# Patient Record
Sex: Female | Born: 1945 | Hispanic: No | Marital: Single | Smoking: Never smoker
Health system: Southern US, Community
[De-identification: ages and names within clinical notes are randomized; demographics above are authoritative.]

## PROBLEM LIST (undated history)

## (undated) DIAGNOSIS — I10 Essential (primary) hypertension: Secondary | ICD-10-CM

## (undated) DIAGNOSIS — E119 Type 2 diabetes mellitus without complications: Secondary | ICD-10-CM

---

## 2004-11-30 ENCOUNTER — Ambulatory Visit: Payer: Self-pay | Admitting: Internal Medicine

## 2006-05-03 ENCOUNTER — Ambulatory Visit: Payer: Self-pay

## 2007-07-17 ENCOUNTER — Ambulatory Visit: Payer: Self-pay | Admitting: Family Medicine

## 2009-04-02 ENCOUNTER — Ambulatory Visit: Payer: Self-pay | Admitting: Family Medicine

## 2009-07-27 ENCOUNTER — Ambulatory Visit: Payer: Self-pay | Admitting: Gastroenterology

## 2010-06-09 ENCOUNTER — Ambulatory Visit: Payer: Self-pay | Admitting: Family Medicine

## 2010-06-22 ENCOUNTER — Ambulatory Visit: Payer: Self-pay | Admitting: Family Medicine

## 2011-01-05 ENCOUNTER — Ambulatory Visit: Payer: Self-pay | Admitting: Family Medicine

## 2011-07-18 ENCOUNTER — Ambulatory Visit: Payer: Self-pay | Admitting: Family Medicine

## 2012-04-23 ENCOUNTER — Ambulatory Visit: Payer: Self-pay | Admitting: Family Medicine

## 2012-06-13 ENCOUNTER — Ambulatory Visit: Payer: Self-pay | Admitting: Family Medicine

## 2012-07-23 ENCOUNTER — Ambulatory Visit: Payer: Self-pay | Admitting: Family Medicine

## 2013-07-24 ENCOUNTER — Ambulatory Visit: Payer: Self-pay | Admitting: Family Medicine

## 2014-07-31 ENCOUNTER — Inpatient Hospital Stay: Payer: Self-pay | Admitting: Internal Medicine

## 2014-07-31 LAB — CBC WITH DIFFERENTIAL/PLATELET
BASOS PCT: 0.8 %
Basophil #: 0.1 10*3/uL (ref 0.0–0.1)
EOS PCT: 6 %
Eosinophil #: 0.8 10*3/uL — ABNORMAL HIGH (ref 0.0–0.7)
HCT: 42.3 % (ref 35.0–47.0)
HGB: 14.5 g/dL (ref 12.0–16.0)
LYMPHS ABS: 1.6 10*3/uL (ref 1.0–3.6)
Lymphocyte %: 12.5 %
MCH: 31.2 pg (ref 26.0–34.0)
MCHC: 34.4 g/dL (ref 32.0–36.0)
MCV: 91 fL (ref 80–100)
MONO ABS: 1.1 x10 3/mm — AB (ref 0.2–0.9)
MONOS PCT: 8.8 %
NEUTROS ABS: 9.1 10*3/uL — AB (ref 1.4–6.5)
NEUTROS PCT: 71.9 %
PLATELETS: 310 10*3/uL (ref 150–440)
RBC: 4.66 10*6/uL (ref 3.80–5.20)
RDW: 13.1 % (ref 11.5–14.5)
WBC: 12.6 10*3/uL — ABNORMAL HIGH (ref 3.6–11.0)

## 2014-07-31 LAB — COMPREHENSIVE METABOLIC PANEL
ANION GAP: 10 (ref 7–16)
Albumin: 3.8 g/dL (ref 3.4–5.0)
Alkaline Phosphatase: 63 U/L (ref 46–116)
BILIRUBIN TOTAL: 1 mg/dL (ref 0.2–1.0)
BUN: 16 mg/dL (ref 7–18)
CHLORIDE: 106 mmol/L (ref 98–107)
CREATININE: 0.79 mg/dL (ref 0.60–1.30)
Calcium, Total: 9.2 mg/dL (ref 8.5–10.1)
Co2: 26 mmol/L (ref 21–32)
GLUCOSE: 114 mg/dL — AB (ref 65–99)
OSMOLALITY: 285 (ref 275–301)
Potassium: 4.1 mmol/L (ref 3.5–5.1)
SGOT(AST): 23 U/L (ref 15–37)
SGPT (ALT): 22 U/L (ref 14–63)
Sodium: 142 mmol/L (ref 136–145)
Total Protein: 7.9 g/dL (ref 6.4–8.2)

## 2014-07-31 LAB — TROPONIN I: Troponin-I: <0.02 ng/mL

## 2014-08-01 DIAGNOSIS — I351 Nonrheumatic aortic (valve) insufficiency: Secondary | ICD-10-CM

## 2014-08-01 LAB — LIPID PANEL
Cholesterol: 121 mg/dL (ref 0–200)
HDL Cholesterol: 44 mg/dL (ref 40–60)
Ldl Cholesterol, Calc: 60 mg/dL (ref 0–100)
TRIGLYCERIDES: 83 mg/dL (ref 0–200)
VLDL Cholesterol, Calc: 17 mg/dL (ref 5–40)

## 2014-08-01 LAB — CBC WITH DIFFERENTIAL/PLATELET
BASOS PCT: 0.7 %
Basophil #: 0.1 10*3/uL (ref 0.0–0.1)
EOS PCT: 7.6 %
Eosinophil #: 0.7 10*3/uL (ref 0.0–0.7)
HCT: 39.4 % (ref 35.0–47.0)
HGB: 13.7 g/dL (ref 12.0–16.0)
Lymphocyte #: 1.7 10*3/uL (ref 1.0–3.6)
Lymphocyte %: 18.1 %
MCH: 31.3 pg (ref 26.0–34.0)
MCHC: 34.9 g/dL (ref 32.0–36.0)
MCV: 90 fL (ref 80–100)
MONO ABS: 0.9 x10 3/mm (ref 0.2–0.9)
Monocyte %: 9.5 %
Neutrophil #: 6.2 10*3/uL (ref 1.4–6.5)
Neutrophil %: 64.1 %
Platelet: 287 10*3/uL (ref 150–440)
RBC: 4.39 10*6/uL (ref 3.80–5.20)
RDW: 12.7 % (ref 11.5–14.5)
WBC: 9.7 10*3/uL (ref 3.6–11.0)

## 2014-08-01 LAB — URINALYSIS, COMPLETE
BACTERIA: NONE SEEN
BILIRUBIN, UR: NEGATIVE
BLOOD: NEGATIVE
GLUCOSE, UR: NEGATIVE mg/dL (ref 0–75)
KETONE: NEGATIVE
Leukocyte Esterase: NEGATIVE
Nitrite: NEGATIVE
Ph: 6 (ref 4.5–8.0)
Protein: NEGATIVE
RBC,UR: 1 /HPF (ref 0–5)
SPECIFIC GRAVITY: 1.011 (ref 1.003–1.030)
WBC UR: 2 /HPF (ref 0–5)

## 2014-08-01 LAB — BASIC METABOLIC PANEL
Anion Gap: 6 — ABNORMAL LOW (ref 7–16)
BUN: 14 mg/dL (ref 7–18)
CREATININE: 0.63 mg/dL (ref 0.60–1.30)
Calcium, Total: 8.3 mg/dL — ABNORMAL LOW (ref 8.5–10.1)
Chloride: 108 mmol/L — ABNORMAL HIGH (ref 98–107)
Co2: 27 mmol/L (ref 21–32)
EGFR (African American): >60
EGFR (Non-African Amer.): >60
GLUCOSE: 107 mg/dL — AB (ref 65–99)
Osmolality: 282 (ref 275–301)
POTASSIUM: 3.8 mmol/L (ref 3.5–5.1)
SODIUM: 141 mmol/L (ref 136–145)

## 2014-08-01 LAB — TSH: THYROID STIMULATING HORM: 0.423 u[IU]/mL — AB

## 2014-08-01 LAB — T4, FREE: Free Thyroxine: 1.41 ng/dL (ref 0.76–1.46)

## 2014-08-01 LAB — HEMOGLOBIN A1C: Hemoglobin A1C: 6.2 % (ref 4.2–6.3)

## 2014-08-01 LAB — MAGNESIUM: Magnesium: 2.1 mg/dL

## 2014-08-18 ENCOUNTER — Encounter: Payer: Self-pay | Admitting: Internal Medicine

## 2014-08-26 ENCOUNTER — Encounter: Payer: Self-pay | Admitting: Internal Medicine

## 2014-10-26 NOTE — H&P (Signed)
PATIENT NAMGlorious Peach:  Gaugh, Alyla MR#:  [id number] DATE OF BIRTH:  [birth year]  DATE OF ADMISSION:  07/31/2014  ADDENDUM  ALLERGIES: NONE.   HOME MEDICATIONS: Zyrtec 10 mg p.o. daily, Zocor 20 mg p.o. at bedtime, Tylenol Arthritis caplet 650 mg p.o. every 8 hours p.r.n., Symbicort 160 mcg/4.5 mcg inhalation 2 puffs twice a day, ProAir HFA CFC 90 mcg 2 puffs 4 times a day, metformin 1000 mg p.o. b.i.d., meloxicam 15 mg p.o. daily, lisinopril 40 mg p.o. daily, gabapentin 800 mg p.o. daily, Fosamax at 70 mg p.o. once a week, calcium 600 +D 600 mg 1 tablet 3 times a day.   ____________________________ Shaune PollackQing Jisselle Poth, MD qc:ap D: 07/31/2014 13:18:07 ET T: 07/31/2014 13:28:46 ET JOB#: [id number]  cc: Shaune PollackQing Bertrice Leder, MD, <Dictator> Shaune PollackQING Jacobb Alen MD ELECTRONICALLY SIGNED 08/01/2014 20:19

## 2014-10-26 NOTE — Discharge Summary (Signed)
PATIENT NAMECORALIE, [name] MR#:  [id number] DATE OF BIRTH:  [birth year]  DATE OF ADMISSION:  07/31/2014 DATE OF DISCHARGE:  08/01/2014  ADMITTING PHYSICIAN: Shaune Pollack, MD   DISCHARGING PHYSICIAN: Enid Baas, MD  PRIMARY CARE PHYSICIAN: Angus Palms, MD  CONSULTATION IN HOSPITAL: Neurology consultation by Pauletta Browns, MD.  DISCHARGE DIAGNOSES:  1.  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  2.  Hypertension.  3.  Arthritis.  4.  Osteoporosis.  5.  Diabetes mellitus.  6.  Neuropathy.   DISCHARGE HOME MEDICATIONS:  1.  Calcium and vitamin D 600 mg/200 international units 1 tablet p.o. 3 times a day.  2.  Fosamax 70 mg p.o. once a week.  3.  Meloxicam 15 mg p.o. daily.  4.  Gabapentin 800 mg p.o. daily.  5.  Tylenol Arthritis 650 mg q. 8 hours p.r.n.  6.  Zyrtec 10 mg p.o. daily.  7.  Lisinopril 40 mg p.o. daily.  8.  ProAir inhaler 2 puffs 4 times a day.  9.  Symbicort 160/4.5 mcg 2 puffs b.i.d.  10.  Metformin 1000 mg p.o. b.i.d.  11.  Zocor 20 mg p.o. daily.  12.  Aspirin 325 mg p.o. daily.   DISCHARGE DIET: Low-sodium, ADA, 1800-calorie diet.   DISCHARGE ACTIVITY: As tolerated.    FOLLOWUP INSTRUCTIONS:  1.  PCP followup in 1 to 2 weeks.  2.  Occupational therapy for left hand as an outpatient.   LABORATORY DATA AND IMAGING STUDIES: Prior to discharge, WBC 9.7, hemoglobin 13.7, hematocrit 39.4, platelet count 287,000. Sodium 141, potassium 3.8, chloride 108, bicarbonate 27, BUN 14, creatinine 0.63, glucose 107, calcium of 8.3. Magnesium 2.1. HbA1c 6.2. TSH is 0.423, but free T4 is normal at 1.41. Echo  Doppler showing LV ejection fraction 55% to 60%, normal EF, impaired relaxation of LV diastolic filling. MRI of the brain without contrast showing 1 x 1 x 2 cm acute infarction affecting right posterior basal ganglion and corona radiata. No acute hemorrhage, swelling, or mass effect noted. 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 Urinalysis negative for any infection. Carotid Dopplers showing no hemodynamically significant stenosis in both arteries. Chest x-ray: Clear lung fields. No consolidations seen. CT of the head without contrast showing no acute cortical infarction. Mild cerebellar atrophy noted. Small focal area of decreased attenuation in right basal ganglia could be ischemia.   BRIEF HOSPITAL COURSE: [name] is a 69 year old [place] female with past medical history significant for arthritis, hypertension, diabetes, who presents from home secondary to slurred speech and also left-sided weakness.  1.  Acute CVA: CT of the head showed possibility of right basal ganglion infarct. Seen by neurology. The patient's speech has improved and left-sided weakness has improved except left  arm and left hand weakness and decreased grip. MRI confirmed infarct and she was not on any antiplatelet agents at home, so started on aspirin. Statin is being continued. LDL is well-controlled at this time. Carotid Dopplers with no hemodynamically significant stenosis. Echo does not show any thrombus. The patient worked with physical therapy and actually did very fine. She will just need outpatient occupational therapy at this time.  2.  All of her other home medications for diabetes and hypertension are being continued without any changes.   DISCHARGE CONDITION: Stable.   DISCHARGE DISPOSITION: Home.   TIME SPENT ON DISCHARGE: 40 minutes.   CODE STATUS: FULL CODE.     ____________________________ Enid Baas, MD rk:ts D:  08/01/2014 15:53:00 ET T: 08/02/2014 01:12:17 ET JOB#: [id number]  cc: Enid Baasadhika Javeah Loeza, MD, <Dictator> Angus PalmsSionne George, MD Enid BaasADHIKA Mylie Mccurley MD ELECTRONICALLY SIGNED 08/04/2014 17:10

## 2014-10-26 NOTE — H&P (Signed)
PATIENT NAMGlorious Stark:  [name], [name] MR#:  [id number] DATE OF BIRTH:  26-Oct-1945  DATE OF ADMISSION:  07/31/2014  PRIMARY CARE PHYSICIAN: Dr. Salli RealGeotre  REFERRING PHYSICIAN: Sheryl L. Mindi JunkerGottlieb, MD  CHIEF COMPLAINT: Left arm weakness and slurred speech since yesterday.   HISTORY OF PRESENT ILLNESS: A 69 year old [place] female with a history of hypertension, diabetes, hyperlipidemia, who was sent from home to ED due to left arm weakness with slurred speech yesterday. The patient is alert, awake, oriented. The patient speaks little [place]. She cannot provide detailed information. According to the patient's son, the patient started to have the left arm weakness and slurred speech during dinner time last night. In addition, the patient felt dizzy, but no headache. No fever or chills. The patient denies any nausea, vomiting, or diarrhea. No incontinence. No loss of consciousness or syncope or seizure. The patient's symptoms are a little bit better today, but she still has slurred speech, so to the patient came to the ED for further evaluation. The patient's CAT scan of the head showed possible CVA. She was treated with aspirin 325 mg 1 dose in the ED.   PAST MEDICAL HISTORY: Hypertension, diabetes, and hyperlipidemia.   SOCIAL HISTORY: No smoking or drinking or illicit drugs.   PAST SURGICAL HISTORY: Eye surgery a long time ago.   FAMILY HISTORY: Diabetes. No stroke or heart attack.   REVIEW OF SYSTEMS:  CONSTITUTIONAL: The patient denies any fever or chills. No headache, but has dizziness and weakness.  EYES: No double vision, blurry vision.  ENT: No postnasal drip, but has slurred speech. No dysphagia.  CARDIOVASCULAR: No chest pain, palpitation, orthopnea, or nocturnal dyspnea. No leg edema.  PULMONARY: No cough, sputum, shortness of breath, or hematemesis.   GASTROINTESTINAL: No abdominal pain, nausea, vomiting, or diarrhea. No melena or bloody stool.  GENITOURINARY: No dysuria, hematuria, or  incontinence.  SKIN: No rash or jaundice.  NEUROLOGIC: Positive for left arm weakness, slurred speech. No seizure, syncope, or loss of consciousness.  HEMATOLOGY: No easy bleeding or bleeding.  ENDOCRINOLOGY: No polyuria, polydipsia, heat or cold intolerance.   PHYSICAL EXAMINATION:   VITAL SIGNS: Temperature 98.1, blood pressure 153/81, pulse 86, oxygen saturation 96% on room air.  GENERAL: The patient is alert, awake, oriented, in no acute distress.  HEENT: Pupils round, equal and reactive to light and accommodation. Moist oral mucosa. Clear oropharynx.  NECK: Supple. No JVD or carotid bruit. No lymphadenopathy. No thyromegaly.  CARDIOVASCULAR: S1, S2 regular rate and rhythm. No murmurs or gallops. PULMONARY: Bilateral air entry. No wheezing or rales. No use of accessory muscle to breathe.  ABDOMEN: Soft. No distention or tenderness. No organomegaly. Bowel sounds present.  EXTREMITIES: No edema, clubbing or cyanosis. No calf tenderness. Bilateral pedal pulses present.  SKIN: No rash or jaundice.  NEUROLOGIC: A and O x3. Mild facial droop on the left side. It is difficult and slow to move the tongue to the left side. Power 5/5. Sensation intact. Left arm is a little bit weaker than the other extremities.   Chest x-ray showed no lung edema or consolidation. CAT scan of head showed no acute cortical infarction, but has mild cerebral atrophy, periventricular and patchy subcortical white matter decreased attenuation, probably due to chronic small vessel ischemic changes. There is a small focal area of decreased attenuation on the right basal ganglia, measures about 8.5 mm. This may be due to his ischemia of indeterminate age.   WBC 12.6, hemoglobin 14.5, platelets 310, glucose 114, BUN 16, creatinine 0.79,  electrolytes normal. Troponin less than 0.02. EKG showed normal sinus rhythm at 88 BPM.   IMPRESSIONS:  1. Acute cerebrovascular accident.  2. Hypertension.  3. Diabetes.  4. Leukocytosis,  possibly due to reaction.   PLAN OF TREATMENT:  1. The patient will be admitted to medical floor with a telemonitor. We will get an MRI of the brain to confirm stroke.  2. We will get an echocardiograph and a carotid duplex and a neurology consult, start neuro check, continue aspirin, and start statin, get a physical therapy evaluation, and a speech study.  3. We will continue the patient's hypertension medication, lisinopril 40 mg p.o. daily. For diabetes, we will start sliding scale and hold metformin.  4. I discussed the patient's condition and plan of treatment with the patient and the patient's son.  CODE STATUS: The patient wants full code.   TIME SPENT: About 56 minutes   ____________________________ Shaune Pollack, MD qc:ap D: 07/31/2014 13:15:21 ET T: 07/31/2014 13:33:21 ET JOB#: [id number]  cc: Shaune Pollack, MD, <Dictator> Shaune Pollack MD ELECTRONICALLY SIGNED 08/04/2014 10:51

## 2014-10-26 NOTE — Consult Note (Signed)
PATIENT NAMGlorious Stark:  [name], Laurice MR#:  [id number] DATE OF BIRTH:  February 15, 1946  DATE OF CONSULTATION:  07/31/2014  REFERRING PHYSICIAN:   CONSULTING PHYSICIAN:  Pauletta BrownsYuriy Darriel Utter, MD  REASON FOR CONSULTATION: Stroke.   HISTORY OF PRESENT ILLNESS: This is a 69 year old [place] female admitted with left upper extremity weakness. Information obtained from the patient's friend who is at bedside, who is English-speaking, as the patient is poorly [place] speaking. She has a past medical history of hypertension, diabetes, hyperlipidemia, presents with a 1-day history of left upper extremity weakness and questionable left facial droop. The patient was having dinner and felt like her left arm was weak.  At home, not on any antiplatelet therapy. Symptoms have improved, but still not back to baseline. No visual changes. No weakness in her lower extremities.   PAST MEDICAL HISTORY: Hypertension, diabetes, hyperlipidemia.   SOCIAL HISTORY: No smoking, drinking, or illicit drug use.   PAST SURGICAL HISTORY: Eye surgery.   FAMILY HISTORY: Noncontributory.   REVIEW OF SYSTEMS: With translation from the patient's friend who is at bedside.  RESPIRATORY: No shortness of breath.  CARDIOVASCULAR: No chest pain.  GASTROINTESTINAL: No abdominal pain.  MUSCULOSKELETAL: Positive weakness in the left upper extremity.  ENDOCRINE: No heat or cold intolerance.   NEUROLOGIC EVALUATION: The patient is alert, awake and oriented. Speech appears to have improved, currently at baseline. Extraocular movements are intact. Questionable left facial droop. Tongue is midline. Uvula elevates symmetrically. Shoulder shrug intact. Left upper extremity drift of 4+. The weakness is proximal at the deltoid. Triceps and biceps are 5/5. Finger grasp is also 5/5 on the left upper extremity. No weakness in the lower extremities. Sensation intact to light touch and temperature. Coordination: Finger-to-nose intact. Gait not assessed. Sensation  intact.   IMAGING STUDIES: : There was a questionable right basal ganglia ischemic stroke on the CAT scan with white matter changes from the history of hypertension.   LABORATORY DATA: Work-up has been reviewed.   IMPRESSION: A 69 year old female with history of hypertension, diabetes, hyperlipidemia, presenting with a 1-day history of left upper extremity weakness that has been improving, as well as slurred speech that has improved and is, per family friend, close to baseline. Suspect right basal ganglia stroke that I see on the CAT scan. I agree with obtaining MRI of the brain. The patient was not on any antiplatelet therapy. On aspirin 325 now. She will continue that as an outpatient. Statin therapy, physical therapy, occupational therapy, ultrasound, carotid Doppler.  Thank you, it was a pleasure seeing this patient. This case was discussed with the patient and the patient's family and friend at bedside.    ____________________________ Pauletta BrownsYuriy Eternity Dexter, MD yz:mw D: 07/31/2014 14:58:33 ET T: 07/31/2014 15:19:33 ET JOB#: [id number]  cc: Pauletta BrownsYuriy Lakasha Mcfall, MD, <Dictator> Pauletta BrownsYURIY Tajia Szeliga MD ELECTRONICALLY SIGNED 08/05/2014 12:10

## 2014-11-05 ENCOUNTER — Other Ambulatory Visit: Payer: Self-pay | Admitting: Family Medicine

## 2014-11-05 DIAGNOSIS — Z1231 Encounter for screening mammogram for malignant neoplasm of breast: Secondary | ICD-10-CM

## 2014-11-18 ENCOUNTER — Ambulatory Visit: Payer: Medicare Other

## 2014-11-19 ENCOUNTER — Ambulatory Visit
Admission: RE | Admit: 2014-11-19 | Discharge: 2014-11-19 | Disposition: A | Discharge status: Home / Self Care | Payer: Medicare Other | Source: Ambulatory Visit | Attending: Family Medicine | Admitting: Family Medicine

## 2014-11-19 DIAGNOSIS — Z1231 Encounter for screening mammogram for malignant neoplasm of breast: Secondary | ICD-10-CM | POA: Insufficient documentation

## 2014-12-18 ENCOUNTER — Other Ambulatory Visit: Payer: Self-pay | Admitting: Family Medicine

## 2014-12-18 DIAGNOSIS — M810 Age-related osteoporosis without current pathological fracture: Secondary | ICD-10-CM

## 2014-12-22 ENCOUNTER — Ambulatory Visit
Admission: RE | Admit: 2014-12-22 | Discharge: 2014-12-22 | Disposition: A | Discharge status: Home / Self Care | Payer: Medicare Other | Source: Ambulatory Visit | Attending: Family Medicine | Admitting: Family Medicine

## 2014-12-22 DIAGNOSIS — M810 Age-related osteoporosis without current pathological fracture: Secondary | ICD-10-CM | POA: Diagnosis present

## 2015-10-13 ENCOUNTER — Other Ambulatory Visit: Payer: Self-pay | Admitting: Family Medicine

## 2015-10-13 DIAGNOSIS — Z1231 Encounter for screening mammogram for malignant neoplasm of breast: Secondary | ICD-10-CM

## 2015-11-24 ENCOUNTER — Ambulatory Visit
Admission: RE | Admit: 2015-11-24 | Discharge: 2015-11-24 | Disposition: A | Discharge status: Home / Self Care | Payer: Medicare Other | Source: Ambulatory Visit | Attending: Family Medicine | Admitting: Family Medicine

## 2015-11-24 ENCOUNTER — Other Ambulatory Visit: Payer: Self-pay | Admitting: Family Medicine

## 2015-11-24 DIAGNOSIS — Z1231 Encounter for screening mammogram for malignant neoplasm of breast: Secondary | ICD-10-CM

## 2016-10-12 ENCOUNTER — Other Ambulatory Visit: Payer: Self-pay | Admitting: Family Medicine

## 2016-10-12 DIAGNOSIS — Z1231 Encounter for screening mammogram for malignant neoplasm of breast: Secondary | ICD-10-CM

## 2016-11-25 ENCOUNTER — Ambulatory Visit
Admission: RE | Admit: 2016-11-25 | Discharge: 2016-11-25 | Disposition: A | Discharge status: Home / Self Care | Payer: Medicare Other | Source: Ambulatory Visit | Attending: Family Medicine | Admitting: Family Medicine

## 2016-11-25 DIAGNOSIS — Z1231 Encounter for screening mammogram for malignant neoplasm of breast: Secondary | ICD-10-CM | POA: Insufficient documentation

## 2016-12-01 ENCOUNTER — Other Ambulatory Visit: Payer: Self-pay | Admitting: Family Medicine

## 2016-12-01 DIAGNOSIS — M810 Age-related osteoporosis without current pathological fracture: Secondary | ICD-10-CM

## 2017-01-19 ENCOUNTER — Ambulatory Visit
Admission: RE | Admit: 2017-01-19 | Discharge: 2017-01-19 | Disposition: A | Discharge status: Home / Self Care | Payer: Medicare Other | Source: Ambulatory Visit | Attending: Family Medicine | Admitting: Family Medicine

## 2017-01-19 DIAGNOSIS — M810 Age-related osteoporosis without current pathological fracture: Secondary | ICD-10-CM | POA: Insufficient documentation

## 2017-01-19 NOTE — Addendum Note (Signed)
Encounter addended by: Cammy CopaBarefoot, Lark Runk L, RT on: 01/19/2017 11:02 AM<BR>    Actions taken: Imaging Exam ended

## 2017-08-22 ENCOUNTER — Other Ambulatory Visit: Payer: Self-pay | Admitting: Family Medicine

## 2017-08-22 ENCOUNTER — Ambulatory Visit
Admission: RE | Admit: 2017-08-22 | Discharge: 2017-08-22 | Disposition: A | Discharge status: Home / Self Care | Payer: Medicare Other | Source: Ambulatory Visit | Attending: Family Medicine | Admitting: Family Medicine

## 2017-08-22 DIAGNOSIS — R531 Weakness: Secondary | ICD-10-CM | POA: Diagnosis not present

## 2017-08-22 DIAGNOSIS — I6782 Cerebral ischemia: Secondary | ICD-10-CM | POA: Diagnosis not present

## 2017-08-22 DIAGNOSIS — G319 Degenerative disease of nervous system, unspecified: Secondary | ICD-10-CM | POA: Insufficient documentation

## 2017-08-22 DIAGNOSIS — Z8673 Personal history of transient ischemic attack (TIA), and cerebral infarction without residual deficits: Secondary | ICD-10-CM | POA: Insufficient documentation

## 2019-10-31 ENCOUNTER — Other Ambulatory Visit: Payer: Self-pay

## 2019-10-31 ENCOUNTER — Emergency Department: Payer: Medicare Other

## 2019-10-31 ENCOUNTER — Inpatient Hospital Stay
Admission: EM | Admit: 2019-10-31 | Discharge: 2019-11-03 | DRG: 871 | Disposition: A | Discharge status: Home Health | Payer: Medicare Other | Attending: Internal Medicine | Admitting: Internal Medicine

## 2019-10-31 ENCOUNTER — Encounter: Payer: Self-pay | Admitting: Emergency Medicine

## 2019-10-31 DIAGNOSIS — Z20822 Contact with and (suspected) exposure to covid-19: Secondary | ICD-10-CM | POA: Diagnosis present

## 2019-10-31 DIAGNOSIS — E785 Hyperlipidemia, unspecified: Secondary | ICD-10-CM | POA: Diagnosis present

## 2019-10-31 DIAGNOSIS — Z7982 Long term (current) use of aspirin: Secondary | ICD-10-CM

## 2019-10-31 DIAGNOSIS — I10 Essential (primary) hypertension: Secondary | ICD-10-CM | POA: Diagnosis present

## 2019-10-31 DIAGNOSIS — J45901 Unspecified asthma with (acute) exacerbation: Secondary | ICD-10-CM | POA: Diagnosis present

## 2019-10-31 DIAGNOSIS — N10 Acute tubulo-interstitial nephritis: Secondary | ICD-10-CM | POA: Diagnosis present

## 2019-10-31 DIAGNOSIS — A4151 Sepsis due to Escherichia coli [E. coli]: Principal | ICD-10-CM | POA: Diagnosis present

## 2019-10-31 DIAGNOSIS — Z79899 Other long term (current) drug therapy: Secondary | ICD-10-CM

## 2019-10-31 DIAGNOSIS — J9601 Acute respiratory failure with hypoxia: Secondary | ICD-10-CM | POA: Diagnosis present

## 2019-10-31 DIAGNOSIS — Z833 Family history of diabetes mellitus: Secondary | ICD-10-CM

## 2019-10-31 DIAGNOSIS — E1165 Type 2 diabetes mellitus with hyperglycemia: Secondary | ICD-10-CM | POA: Diagnosis present

## 2019-10-31 DIAGNOSIS — N12 Tubulo-interstitial nephritis, not specified as acute or chronic: Secondary | ICD-10-CM

## 2019-10-31 DIAGNOSIS — A419 Sepsis, unspecified organism: Secondary | ICD-10-CM | POA: Diagnosis present

## 2019-10-31 DIAGNOSIS — Z7983 Long term (current) use of bisphosphonates: Secondary | ICD-10-CM

## 2019-10-31 DIAGNOSIS — Z8673 Personal history of transient ischemic attack (TIA), and cerebral infarction without residual deficits: Secondary | ICD-10-CM

## 2019-10-31 DIAGNOSIS — Z7951 Long term (current) use of inhaled steroids: Secondary | ICD-10-CM

## 2019-10-31 DIAGNOSIS — Z8701 Personal history of pneumonia (recurrent): Secondary | ICD-10-CM

## 2019-10-31 DIAGNOSIS — Z7984 Long term (current) use of oral hypoglycemic drugs: Secondary | ICD-10-CM

## 2019-10-31 HISTORY — DX: Essential (primary) hypertension: I10

## 2019-10-31 HISTORY — DX: Type 2 diabetes mellitus without complications: E11.9

## 2019-10-31 LAB — COMPREHENSIVE METABOLIC PANEL
ALT: 45 U/L — ABNORMAL HIGH (ref 0–44)
AST: 34 U/L (ref 15–41)
Albumin: 3.4 g/dL — ABNORMAL LOW (ref 3.5–5.0)
Alkaline Phosphatase: 127 U/L — ABNORMAL HIGH (ref 38–126)
Anion gap: 11 (ref 5–15)
BUN: 17 mg/dL (ref 8–23)
CO2: 22 mmol/L (ref 22–32)
Calcium: 8.5 mg/dL — ABNORMAL LOW (ref 8.9–10.3)
Chloride: 101 mmol/L (ref 98–111)
Creatinine, Ser: 0.83 mg/dL (ref 0.44–1.00)
GFR calc Af Amer: >60 mL/min (ref >60)
GFR calc non Af Amer: >60 mL/min (ref >60)
Glucose, Bld: 331 mg/dL — ABNORMAL HIGH (ref 70–99)
Potassium: 3.8 mmol/L (ref 3.5–5.1)
Sodium: 134 mmol/L — ABNORMAL LOW (ref 135–145)
Total Bilirubin: 1.8 mg/dL — ABNORMAL HIGH (ref 0.3–1.2)
Total Protein: 7 g/dL (ref 6.5–8.1)

## 2019-10-31 LAB — CBC WITH DIFFERENTIAL/PLATELET
Abs Immature Granulocytes: 0.09 10*3/uL — ABNORMAL HIGH (ref 0.00–0.07)
Basophils Absolute: 0.1 10*3/uL (ref 0.0–0.1)
Basophils Relative: 0 %
Eosinophils Absolute: 0 10*3/uL (ref 0.0–0.5)
Eosinophils Relative: 0 %
HCT: 34.7 % — ABNORMAL LOW (ref 36.0–46.0)
Hemoglobin: 12.3 g/dL (ref 12.0–15.0)
Immature Granulocytes: 1 %
Lymphocytes Relative: 4 %
Lymphs Abs: 0.5 10*3/uL — ABNORMAL LOW (ref 0.7–4.0)
MCH: 31.3 pg (ref 26.0–34.0)
MCHC: 35.4 g/dL (ref 30.0–36.0)
MCV: 88.3 fL (ref 80.0–100.0)
Monocytes Absolute: 0.7 10*3/uL (ref 0.1–1.0)
Monocytes Relative: 5 %
Neutro Abs: 11.8 10*3/uL — ABNORMAL HIGH (ref 1.7–7.7)
Neutrophils Relative %: 90 %
Platelets: 280 10*3/uL (ref 150–400)
RBC: 3.93 MIL/uL (ref 3.87–5.11)
RDW: 11.7 % (ref 11.5–15.5)
WBC: 13.1 10*3/uL — ABNORMAL HIGH (ref 4.0–10.5)
nRBC: 0 % (ref 0.0–0.2)

## 2019-10-31 LAB — APTT: aPTT: 34 seconds (ref 24–36)

## 2019-10-31 LAB — TROPONIN I (HIGH SENSITIVITY): Troponin I (High Sensitivity): 17 ng/L (ref <18)

## 2019-10-31 LAB — POC SARS CORONAVIRUS 2 AG: SARS Coronavirus 2 Ag: NEGATIVE

## 2019-10-31 LAB — PROTIME-INR
INR: 1.1 (ref 0.8–1.2)
Prothrombin Time: 13.6 seconds (ref 11.4–15.2)

## 2019-10-31 LAB — LACTIC ACID, PLASMA: Lactic Acid, Venous: 1.7 mmol/L (ref 0.5–1.9)

## 2019-10-31 MED ORDER — ACETAMINOPHEN 500 MG PO TABS
1000.0000 mg | ORAL_TABLET | Freq: Once | ORAL | Status: AC
  Administered 2019-10-31: 1000 mg via ORAL
  Filled 2019-10-31: qty 2

## 2019-10-31 MED ORDER — SODIUM CHLORIDE 0.9 % IV BOLUS
1000.0000 mL | Freq: Once | INTRAVENOUS | Status: AC
  Administered 2019-10-31: 1000 mL via INTRAVENOUS

## 2019-10-31 NOTE — ED Triage Notes (Signed)
PT to ED via EMS from home c/o increased fatigue last couple days and trouble breathing. PT room air sats are 89%. On 2L pt is 94%. PT also states abd is more swollen than normal. PT alert/oriented/keenly responsive.

## 2019-10-31 NOTE — ED Provider Notes (Signed)
[facility] Emergency Department Provider Note  ____________________________________________   First MD Initiated Contact with Patient 10/31/19 2303     (approximate)  I have reviewed the triage vital signs and the nursing notes.   HISTORY  Chief Complaint Shortness of Breath and Fatigue    HPI [name] is a 74 y.o. female with prior CVA, diabetes who comes in with shortness of breath and fatigue.  Patient does report some history of lung issues but states that she is not sure where he lives.  On review of [facility] chart it appears that she has a history of some asthma.  Patient comes in for a few days of difficulty breathing.  Things got worse today.  Symptoms are severe, constant, worse with moving, better at rest.  Patient states that she is not normally on oxygen.  Patient sats were 89% on room air and patient was placed on 2 L.  Patient denies any abdominal pain, chest pain, leg swelling.  sHe does report a little dysuria.  [place] interpreter was used for the history and physical and any updates          Past Medical History:  Diagnosis Date  . Diabetes mellitus without complication (HCC)   . Hypertension     There are no problems to display for this patient.   History reviewed. No pertinent surgical history.  Prior to Admission medications   Medication Sig Start Date End Date Taking? Authorizing Provider  alendronate (FOSAMAX) 70 MG tablet Take 70 mg by mouth once a week. 08/19/19  Yes [provider]  amLODipine (NORVASC) 5 MG tablet Take 5 mg by mouth daily. 10/21/19  Yes [provider]  aspirin 81 MG EC tablet Take 81 mg by mouth daily.    Yes [provider]  Calcium Carbonate-Vitamin D 600-400 MG-UNIT tablet Take 1 tablet by mouth daily.    Yes [provider]  cetirizine (ZYRTEC) 10 MG tablet Take 1 tablet by mouth at bedtime   Yes [provider]  gabapentin (NEURONTIN) 800 MG tablet  Take 800 mg by mouth at bedtime. 08/19/19  Yes [provider]  lisinopril (ZESTRIL) 40 MG tablet Take 40 mg by mouth daily. 10/21/19  Yes [provider]  metFORMIN (GLUCOPHAGE) 1000 MG tablet Take 1,000 mg by mouth daily. 10/21/19  Yes [provider]  Omega-3 Fatty Acids (FISH OIL) 1000 MG CAPS Take 1 capsule by mouth daily.    Yes [provider]  simvastatin (ZOCOR) 20 MG tablet Take 20 mg by mouth at bedtime. 10/21/19  Yes [provider]  SYMBICORT 160-4.5 MCG/ACT inhaler Inhale 2 puffs into the lungs daily.  09/11/19  Yes [provider]  VENTOLIN HFA 108 (90 Base) MCG/ACT inhaler Inhale 2 puffs into the lungs every 4 (four) hours as needed.  10/21/19  Yes [provider]    Allergies Patient has no known allergies.  No family history on file.  Social History Social History   Tobacco Use  . Smoking status: Never Smoker  . Smokeless tobacco: Never Used  Substance Use Topics  . Alcohol use: Never  . Drug use: Never      Review of Systems Constitutional: No fever/chills Eyes: No visual changes. ENT: No sore throat. Cardiovascular: No chest pain Respiratory: Positive for SOB Gastrointestinal: No abdominal pain.  No nausea, no vomiting.  No diarrhea.  No constipation. Genitourinary: Positive dysuria Musculoskeletal: Negative for back pain. Skin: Negative for rash. Neurological: Negative for headaches,  focal weakness or numbness. All other ROS negative ____________________________________________   PHYSICAL EXAM:  VITAL SIGNS: Blood pressure (!) 123/58, pulse (!) 118, temperature (!) 100.6 F (38.1 C), temperature source Oral, resp. rate (!) 28, height 5' (1.524 m), weight 40 kg, SpO2 93 %.   Constitutional: Alert and oriented. Well appearing and in no acute distress. Eyes: Conjunctivae are normal. EOMI. Head: Atraumatic. Nose: No congestion/rhinnorhea. Mouth/Throat: Mucous membranes are moist.   Neck: No  stridor. Trachea Midline. FROM Cardiovascular: Tachycardic, regular rhythm. Grossly normal heart sounds.  Good peripheral circulation. Respiratory: Clear lungs although satting 94% on 2 L Gastrointestinal: Soft and nontender. No distention. No abdominal bruits.  Musculoskeletal: No lower extremity tenderness nor edema.  No joint effusions. Neurologic:  Normal speech and language. No gross focal neurologic deficits are appreciated.  Skin:  Skin is warm, dry and intact. No rash noted. Psychiatric: Mood and affect are normal. Speech and behavior are normal. GU: Deferred   ____________________________________________   LABS (all labs ordered are listed, but only abnormal results are displayed)  Labs Reviewed  COMPREHENSIVE METABOLIC PANEL - Abnormal; Notable for the following components:      Result Value   Sodium 134 (*)    Glucose, Bld 331 (*)    Calcium 8.5 (*)    Albumin 3.4 (*)    ALT 45 (*)    Alkaline Phosphatase 127 (*)    Total Bilirubin 1.8 (*)    All other components within normal limits  CBC WITH DIFFERENTIAL/PLATELET - Abnormal; Notable for the following components:   WBC 13.1 (*)    HCT 34.7 (*)    Neutro Abs 11.8 (*)    Lymphs Abs 0.5 (*)    Abs Immature Granulocytes 0.09 (*)    All other components within normal limits  URINALYSIS, ROUTINE W REFLEX MICROSCOPIC - Abnormal; Notable for the following components:   Color, Urine YELLOW (*)    APPearance HAZY (*)    Glucose, UA >=500 (*)    Hgb urine dipstick SMALL (*)    Ketones, ur 5 (*)    Protein, ur 100 (*)    Nitrite POSITIVE (*)    Leukocytes,Ua MODERATE (*)    WBC, UA >50 (*)    All other components within normal limits  BRAIN NATRIURETIC PEPTIDE - Abnormal; Notable for the following components:   B Natriuretic Peptide 133.0 (*)    All other components within normal limits  TROPONIN I (HIGH SENSITIVITY) - Abnormal; Notable for the following components:   Troponin I (High Sensitivity) 22 (*)    All other  components within normal limits  RESPIRATORY PANEL BY RT PCR (FLU A&B, COVID)  CULTURE, BLOOD (ROUTINE X 2)  CULTURE, BLOOD (ROUTINE X 2)  URINE CULTURE  LACTIC ACID, PLASMA  APTT  PROTIME-INR  PROCALCITONIN  HIV ANTIBODY (ROUTINE TESTING W REFLEX)  BASIC METABOLIC PANEL  CBC  POC SARS CORONAVIRUS 2 AG -  ED  POC SARS CORONAVIRUS 2 AG  TROPONIN I (HIGH SENSITIVITY)   ____________________________________________   ED ECG REPORT I, Vanessa Marathon, the attending physician, personally viewed and interpreted this ECG.  EKG is sinus tachycardia rate of 112, no ST elevation, T wave inversion 2 3 aVF V4 through V6. ____________________________________________  RADIOLOGY I, Vanessa Eastman, personally viewed and evaluated these images (plain radiographs) as part of my medical decision making, as well as reviewing the written report by the radiologist.  ED MD interpretation: X-rays negative  Official radiology report(s): CT Angio Chest PE  W and/or Wo Contrast  Addendum Date: 11/01/2019   ADDENDUM REPORT: 11/01/2019 02:15 ADDENDUM: Additional impression point: 7. Heterogeneous, mildly enlarged thyroid extending into the thoracic inlet. Recommend outpatient thyroid ultrasound for further assessment. This follows consensus guidelines: Managing Incidental Thyroid Nodules Detected on Imaging: White Paper of the ACR Incidental Thyroid Findings Committee. J Am Coll Radiol 2015; 12:143-150. and [facility] 3-tiered system for managing ITNs: J Am Coll Radiol. 2015; Feb;12(2): 143-50 Electronically Signed   By: Kreg Shropshire M.D.   On: 11/01/2019 02:15   Result Date: 11/01/2019 CLINICAL DATA:  Shortness of breath, difficulty breathing EXAM: CT ANGIOGRAPHY CHEST WITH CONTRAST TECHNIQUE: Multidetector CT imaging of the chest was performed using the standard protocol during bolus administration of intravenous contrast. Multiplanar CT image reconstructions and MIPs were obtained to evaluate the vascular anatomy.  CONTRAST:  18mL OMNIPAQUE IOHEXOL 350 MG/ML SOLN COMPARISON:  Radiograph 10/31/2019, CT 10/03/2002 (report only) FINDINGS: Cardiovascular: Satisfactory opacification the pulmonary arteries. No central or lobar filling defects are identified. More distal evaluation limited by respiratory motion artifact more pronounced towards the lung bases and apices. Mild cardiomegaly. No pericardial effusion. Coronary artery calcifications are present. There is atherosclerotic calcification of the normal caliber thoracic aorta and normally branching great vessels. No acute luminal or periaortic abnormality is seen. Mediastinum/Nodes: No mediastinal fluid or gas. Heterogeneous, enlarged thyroid gland partially extending into the thoracic inlet. No acute abnormality of the trachea or esophagus. No worrisome mediastinal, hilar or axillary adenopathy. Lungs/Pleura: Parenchymal evaluation limited given extensive respiratory motion artifact. No consolidation, features of edema, pneumothorax, or effusion. Dependent atelectasis posteriorly. More bandlike opacity in the left lung base likely reflect subsegmental atelectasis or scarring. Upper Abdomen: Few dependently layering gallstones within the otherwise normal gallbladder. Fluid attenuation probable cyst measuring up to 2.6 cm seen in the caudate lobe. Diffuse hepatic hypoattenuation compatible with hepatic steatosis. Musculoskeletal: No chest wall mass or suspicious bone lesions identified. Multilevel degenerative changes are present in the imaged portions of the spine. Review of the MIP images confirms the above findings. IMPRESSION: 1. Satisfactory opacification the pulmonary arteries. No central or lobar filling defects are identified. More distal evaluation limited by respiratory motion artifact. 2. No acute cardiopulmonary process. 3. Mild cardiomegaly. Coronary artery calcifications. 4. Hepatic steatosis. 5. Cholelithiasis without CT evidence of acute cholecystitis. 6. Aortic  Atherosclerosis (ICD10-I70.0). Electronically Signed: By: Kreg Shropshire M.D. On: 11/01/2019 00:42   DG Chest Port 1 View  Result Date: 10/31/2019 CLINICAL DATA:  74 year old female with shortness of breath. EXAM: PORTABLE CHEST 1 VIEW COMPARISON:  Chest radiograph dated 07/31/2014. FINDINGS: Mild cardiomegaly with mild vascular congestion. Atypical infiltrate is not excluded clinical correlation is recommended. No focal consolidation, pleural effusion, pneumothorax. Atherosclerotic calcification of the aorta. Degenerative changes of the spine. No acute osseous pathology. IMPRESSION: Mild cardiomegaly with mild vascular congestion. No focal consolidation. Electronically Signed   By: Elgie Collard M.D.   On: 10/31/2019 23:35   CT Renal Stone Study  Result Date: 11/01/2019 CLINICAL DATA:  74 year old female with flank pain. Concern for kidney stone. EXAM: CT ABDOMEN AND PELVIS WITHOUT CONTRAST TECHNIQUE: Multidetector CT imaging of the abdomen and pelvis was performed following the standard protocol without IV contrast. COMPARISON:  Chest CT dated 11/01/2019. FINDINGS: Evaluation of this exam is limited in the absence of intravenous contrast. Lower chest: Minimal bibasilar atelectasis. The visualized lung bases are clear. No intra-abdominal free air or free fluid. Hepatobiliary: Diffuse fatty infiltration of the liver. No intrahepatic biliary ductal dilatation. Gallstone. No pericholecystic fluid.  Pancreas: Unremarkable. No pancreatic ductal dilatation or surrounding inflammatory changes. Spleen: Normal in size without focal abnormality. Adrenals/Urinary Tract: The adrenal glands are unremarkable. There is slight asymmetric enlargement of the left kidney. There is heterogeneous enhancement of the left renal parenchyma with striated pattern nephrogram from recent contrast administration most consistent with left-sided pyelonephritis. Correlation with urinalysis recommended. No drainable fluid collection or  abscess. The right kidney is unremarkable. The visualized ureters and urinary bladder appear unremarkable as well. Stomach/Bowel: There is no bowel obstruction or active inflammation. Mild diffuse thickened appearance of the stomach likely related to underdistention. The appendix is normal. Vascular/Lymphatic: Mild aortoiliac atherosclerotic disease. The IVC is unremarkable. No portal venous gas. There is no adenopathy. Reproductive: Enlarged myomatous uterus. No adnexal masses. Other: None Musculoskeletal: Osteopenia with degenerative changes of the spine. Old L1 compression fracture with anterior wedging. No acute osseous pathology. IMPRESSION: 1. Findings most consistent with left-sided pyelonephritis. Correlation with urinalysis recommended. No drainable fluid collection or abscess. 2. Fatty liver. 3. Cholelithiasis. 4. No bowel obstruction. Normal appendix. 5. Enlarged myomatous uterus. 6. Aortic Atherosclerosis (ICD10-I70.0). Electronically Signed   By: Elgie CollardArash  Radparvar M.D.   On: 11/01/2019 02:35    ____________________________________________   PROCEDURES  Procedure(s) performed (including Critical Care):  .Critical Care Performed by: Concha SeFunke, Andranik Jeune E, MD Authorized by: Concha SeFunke, Kaytlynne Neace E, MD   Critical care provider statement:    Critical care time (minutes):  45   Critical care was necessary to treat or prevent imminent or life-threatening deterioration of the following conditions:  Sepsis   Critical care was time spent personally by me on the following activities:  Discussions with consultants, evaluation of patient's response to treatment, examination of patient, ordering and performing treatments and interventions, ordering and review of laboratory studies, ordering and review of radiographic studies, pulse oximetry, re-evaluation of patient's condition, obtaining history from patient or surrogate and review of old charts     ____________________________________________   INITIAL  IMPRESSION / ASSESSMENT AND PLAN / ED COURSE   [name] PeachBong Stark was evaluated in Emergency Department on 10/31/2019 for the symptoms described in the history of present illness. She was evaluated in the context of the global COVID-19 pandemic, which necessitated consideration that the patient might be at risk for infection with the SARS-CoV-2 virus that causes COVID-19. Institutional protocols and algorithms that pertain to the evaluation of patients at risk for COVID-19 are in a state of rapid change based on information released by regulatory bodies including the CDC and federal and state organizations. These policies and algorithms were followed during the patient's care in the ED.     Pt presents with SOB. Differential includes: PNA-will get xray to evaluation Anemia-CBC to evaluate ACS- will get trops Arrhythmia-Will get EKG and keep on monitor.  COVID- will get testing per algorithm. PE-lower suspicion given no risk factors and other cause more likely  Initially held off on sepsis alert given my concern this could just be Covid.  Patient's Covid test was negative.  CT PE negative as well.  Given this we will initiate sepsis bundle and start broad-spectrum antibiotics.  Patient procalcitonin is significantly elevated therefore I think this is most likely a bacterial infection.  Given this patient son is at bedside who does report that patient has had some dysuria maybe little hematuria and back pain as well.  We will add on CT renal to make sure no signs of infected kidney stone.  UA consistent with UTI CT scan does show pyelonephritis.  After fluid  resuscitation as patient's heart rate has come down.  Blood pressures remained stable.  Patient's temperature also came down with the Tylenol.  Patient is continues to look well.  Patient admitted to the hospital team  ____________________________________________   FINAL CLINICAL IMPRESSION(S) / ED DIAGNOSES   Final diagnoses:  Sepsis, due to  unspecified organism, unspecified whether acute organ dysfunction present (HCC)  Pyelonephritis  Acute respiratory failure with hypoxia (HCC)     MEDICATIONS GIVEN DURING THIS VISIT:  Medications  aspirin EC tablet 81 mg (has no administration in time range)  lisinopril (ZESTRIL) tablet 40 mg (has no administration in time range)  simvastatin (ZOCOR) tablet 20 mg (20 mg Oral Not Given 11/01/19 0245)  gabapentin (NEURONTIN) capsule 800 mg (800 mg Oral Not Given 11/01/19 0245)  calcium-vitamin D (OSCAL WITH D) 500-200 MG-UNIT per tablet 1 tablet (has no administration in time range)  omega-3 acid ethyl esters (LOVAZA) capsule 1 g (has no administration in time range)  loratadine (CLARITIN) tablet 10 mg (has no administration in time range)  albuterol (PROVENTIL) (2.5 MG/3ML) 0.083% nebulizer solution 2.5 mg (has no administration in time range)  enoxaparin (LOVENOX) injection 40 mg (has no administration in time range)  0.9 %  sodium chloride infusion (has no administration in time range)  acetaminophen (TYLENOL) tablet 650 mg (has no administration in time range)    Or  acetaminophen (TYLENOL) suppository 650 mg (has no administration in time range)  traZODone (DESYREL) tablet 25 mg (has no administration in time range)  magnesium hydroxide (MILK OF MAGNESIA) suspension 30 mL (has no administration in time range)  ondansetron (ZOFRAN) tablet 4 mg (has no administration in time range)    Or  ondansetron (ZOFRAN) injection 4 mg (has no administration in time range)  cefTRIAXone (ROCEPHIN) 2 g in sodium chloride 0.9 % 100 mL IVPB (has no administration in time range)  azithromycin (ZITHROMAX) 500 mg in sodium chloride 0.9 % 250 mL IVPB (has no administration in time range)  amLODipine (NORVASC) tablet 5 mg (has no administration in time range)  insulin aspart (novoLOG) injection 0-9 Units (has no administration in time range)  acetaminophen (TYLENOL) tablet 1,000 mg (1,000 mg Oral Given 10/31/19  2321)  sodium chloride 0.9 % bolus 1,000 mL (0 mLs Intravenous Stopped 11/01/19 0127)  iohexol (OMNIPAQUE) 350 MG/ML injection 75 mL (75 mLs Intravenous Contrast Given 11/01/19 0020)  ceFEPIme (MAXIPIME) 2 g in sodium chloride 0.9 % 100 mL IVPB (0 g Intravenous Stopped 11/01/19 0203)  metroNIDAZOLE (FLAGYL) IVPB 500 mg (0 mg Intravenous Stopped 11/01/19 0203)  vancomycin (VANCOCIN) IVPB 1000 mg/200 mL premix (0 mg Intravenous Stopped 11/01/19 0203)  sodium chloride 0.9 % bolus 500 mL (500 mLs Intravenous New Bag/Given 11/01/19 0108)     ED Discharge Orders    None       Note:  This document was prepared using Dragon voice recognition software and may include unintentional dictation errors.   Concha Se, MD 11/01/19 [phone]

## 2019-11-01 ENCOUNTER — Inpatient Hospital Stay: Payer: Medicare Other

## 2019-11-01 ENCOUNTER — Encounter: Payer: Self-pay | Admitting: Radiology

## 2019-11-01 ENCOUNTER — Emergency Department: Payer: Medicare Other

## 2019-11-01 ENCOUNTER — Other Ambulatory Visit: Payer: Self-pay

## 2019-11-01 DIAGNOSIS — A4151 Sepsis due to Escherichia coli [E. coli]: Secondary | ICD-10-CM | POA: Diagnosis present

## 2019-11-01 DIAGNOSIS — J45901 Unspecified asthma with (acute) exacerbation: Secondary | ICD-10-CM | POA: Diagnosis present

## 2019-11-01 DIAGNOSIS — Z7982 Long term (current) use of aspirin: Secondary | ICD-10-CM | POA: Diagnosis not present

## 2019-11-01 DIAGNOSIS — N12 Tubulo-interstitial nephritis, not specified as acute or chronic: Secondary | ICD-10-CM

## 2019-11-01 DIAGNOSIS — Z833 Family history of diabetes mellitus: Secondary | ICD-10-CM | POA: Diagnosis not present

## 2019-11-01 DIAGNOSIS — Z20822 Contact with and (suspected) exposure to covid-19: Secondary | ICD-10-CM | POA: Diagnosis present

## 2019-11-01 DIAGNOSIS — Z7983 Long term (current) use of bisphosphonates: Secondary | ICD-10-CM | POA: Diagnosis not present

## 2019-11-01 DIAGNOSIS — I10 Essential (primary) hypertension: Secondary | ICD-10-CM | POA: Diagnosis present

## 2019-11-01 DIAGNOSIS — N10 Acute tubulo-interstitial nephritis: Secondary | ICD-10-CM | POA: Diagnosis present

## 2019-11-01 DIAGNOSIS — Z79899 Other long term (current) drug therapy: Secondary | ICD-10-CM | POA: Diagnosis not present

## 2019-11-01 DIAGNOSIS — Z7951 Long term (current) use of inhaled steroids: Secondary | ICD-10-CM | POA: Diagnosis not present

## 2019-11-01 DIAGNOSIS — A419 Sepsis, unspecified organism: Secondary | ICD-10-CM | POA: Diagnosis present

## 2019-11-01 DIAGNOSIS — E785 Hyperlipidemia, unspecified: Secondary | ICD-10-CM

## 2019-11-01 DIAGNOSIS — J9601 Acute respiratory failure with hypoxia: Secondary | ICD-10-CM

## 2019-11-01 DIAGNOSIS — E119 Type 2 diabetes mellitus without complications: Secondary | ICD-10-CM | POA: Diagnosis not present

## 2019-11-01 DIAGNOSIS — E1165 Type 2 diabetes mellitus with hyperglycemia: Secondary | ICD-10-CM | POA: Diagnosis present

## 2019-11-01 DIAGNOSIS — Z8701 Personal history of pneumonia (recurrent): Secondary | ICD-10-CM | POA: Diagnosis not present

## 2019-11-01 DIAGNOSIS — Z7984 Long term (current) use of oral hypoglycemic drugs: Secondary | ICD-10-CM | POA: Diagnosis not present

## 2019-11-01 DIAGNOSIS — Z8673 Personal history of transient ischemic attack (TIA), and cerebral infarction without residual deficits: Secondary | ICD-10-CM | POA: Diagnosis not present

## 2019-11-01 LAB — CBC
HCT: 30.4 % — ABNORMAL LOW (ref 36.0–46.0)
Hemoglobin: 10.7 g/dL — ABNORMAL LOW (ref 12.0–15.0)
MCH: 31.7 pg (ref 26.0–34.0)
MCHC: 35.2 g/dL (ref 30.0–36.0)
MCV: 89.9 fL (ref 80.0–100.0)
Platelets: 275 10*3/uL (ref 150–400)
RBC: 3.38 MIL/uL — ABNORMAL LOW (ref 3.87–5.11)
RDW: 12.1 % (ref 11.5–15.5)
WBC: 17.5 10*3/uL — ABNORMAL HIGH (ref 4.0–10.5)
nRBC: 0 % (ref 0.0–0.2)

## 2019-11-01 LAB — BLOOD CULTURE ID PANEL (REFLEXED)

## 2019-11-01 LAB — GLUCOSE, CAPILLARY
Glucose-Capillary: 229 mg/dL — ABNORMAL HIGH (ref 70–99)
Glucose-Capillary: 259 mg/dL — ABNORMAL HIGH (ref 70–99)
Glucose-Capillary: 210 mg/dL — ABNORMAL HIGH (ref 70–99)
Glucose-Capillary: 163 mg/dL — ABNORMAL HIGH (ref 70–99)
Glucose-Capillary: 189 mg/dL — ABNORMAL HIGH (ref 70–99)

## 2019-11-01 LAB — BASIC METABOLIC PANEL
Anion gap: 5 (ref 5–15)
BUN: 16 mg/dL (ref 8–23)
CO2: 23 mmol/L (ref 22–32)
Calcium: 7.6 mg/dL — ABNORMAL LOW (ref 8.9–10.3)
Chloride: 109 mmol/L (ref 98–111)
Creatinine, Ser: 0.81 mg/dL (ref 0.44–1.00)
GFR calc Af Amer: >60 mL/min (ref >60)
GFR calc non Af Amer: >60 mL/min (ref >60)
Glucose, Bld: 285 mg/dL — ABNORMAL HIGH (ref 70–99)
Potassium: 3.9 mmol/L (ref 3.5–5.1)
Sodium: 137 mmol/L (ref 135–145)

## 2019-11-01 LAB — HEMOGLOBIN A1C
Hgb A1c MFr Bld: 8.1 % — ABNORMAL HIGH (ref 4.8–5.6)
Mean Plasma Glucose: 185.77 mg/dL

## 2019-11-01 LAB — RESPIRATORY PANEL BY RT PCR (FLU A&B, COVID)
Influenza A by PCR: NEGATIVE
Influenza B by PCR: NEGATIVE
SARS Coronavirus 2 by RT PCR: NEGATIVE

## 2019-11-01 LAB — URINALYSIS, ROUTINE W REFLEX MICROSCOPIC
Bacteria, UA: NONE SEEN
Bilirubin Urine: NEGATIVE
Glucose, UA: >=500 mg/dL — AB
Ketones, ur: 5 mg/dL — AB
Nitrite: POSITIVE — AB
Protein, ur: 100 mg/dL — AB
Specific Gravity, Urine: 1.029 (ref 1.005–1.030)
WBC, UA: >50 WBC/hpf — ABNORMAL HIGH (ref 0–5)
pH: 6 (ref 5.0–8.0)

## 2019-11-01 LAB — HIV ANTIBODY (ROUTINE TESTING W REFLEX): HIV Screen 4th Generation wRfx: NONREACTIVE

## 2019-11-01 LAB — BRAIN NATRIURETIC PEPTIDE: B Natriuretic Peptide: 133 pg/mL — ABNORMAL HIGH (ref 0.0–100.0)

## 2019-11-01 LAB — TROPONIN I (HIGH SENSITIVITY): Troponin I (High Sensitivity): 22 ng/L — ABNORMAL HIGH (ref <18)

## 2019-11-01 LAB — PROCALCITONIN: Procalcitonin: 6.96 ng/mL

## 2019-11-01 MED ORDER — SODIUM CHLORIDE 0.9 % IV SOLN
2.0000 g | INTRAVENOUS | Status: DC
  Filled 2019-11-01: qty 20

## 2019-11-01 MED ORDER — ACETAMINOPHEN 650 MG RE SUPP: 650.0000 mg | Freq: Four times a day (QID) | RECTAL | Status: DC | PRN

## 2019-11-01 MED ORDER — LISINOPRIL 20 MG PO TABS
40.0000 mg | ORAL_TABLET | Freq: Every day | ORAL | Status: DC
  Administered 2019-11-01 – 2019-11-03 (×3): 40 mg via ORAL
  Filled 2019-11-01 (×3): qty 2

## 2019-11-01 MED ORDER — ONDANSETRON HCL 4 MG/2ML IJ SOLN: 4.0000 mg | Freq: Four times a day (QID) | INTRAMUSCULAR | Status: DC | PRN

## 2019-11-01 MED ORDER — IOHEXOL 350 MG/ML SOLN
75.0000 mL | Freq: Once | INTRAVENOUS | Status: AC | PRN
  Administered 2019-11-01: 75 mL via INTRAVENOUS

## 2019-11-01 MED ORDER — ALENDRONATE SODIUM 70 MG PO TABS: 70.0000 mg | ORAL_TABLET | ORAL | Status: DC

## 2019-11-01 MED ORDER — SODIUM CHLORIDE 0.9 % IV SOLN
1.0000 g | Freq: Two times a day (BID) | INTRAVENOUS | Status: DC
  Administered 2019-11-01 – 2019-11-02 (×4): 1 g via INTRAVENOUS
  Filled 2019-11-01 (×7): qty 1

## 2019-11-01 MED ORDER — SIMVASTATIN 20 MG PO TABS
20.0000 mg | ORAL_TABLET | Freq: Every day | ORAL | Status: DC
  Administered 2019-11-01 – 2019-11-02 (×2): 20 mg via ORAL
  Filled 2019-11-01 (×2): qty 1

## 2019-11-01 MED ORDER — METRONIDAZOLE IN NACL 5-0.79 MG/ML-% IV SOLN
500.0000 mg | Freq: Once | INTRAVENOUS | Status: AC
  Administered 2019-11-01: 500 mg via INTRAVENOUS
  Filled 2019-11-01: qty 100

## 2019-11-01 MED ORDER — ENOXAPARIN SODIUM 40 MG/0.4ML ~~LOC~~ SOLN
40.0000 mg | SUBCUTANEOUS | Status: DC
  Administered 2019-11-01: 40 mg via SUBCUTANEOUS
  Filled 2019-11-01: qty 0.4

## 2019-11-01 MED ORDER — MAGNESIUM HYDROXIDE 400 MG/5ML PO SUSP: 30.0000 mL | Freq: Every day | ORAL | Status: DC | PRN

## 2019-11-01 MED ORDER — LORATADINE 10 MG PO TABS
10.0000 mg | ORAL_TABLET | Freq: Every day | ORAL | Status: DC
  Administered 2019-11-01 – 2019-11-03 (×3): 10 mg via ORAL
  Filled 2019-11-01 (×3): qty 1

## 2019-11-01 MED ORDER — ACETAMINOPHEN 325 MG PO TABS
650.0000 mg | ORAL_TABLET | Freq: Four times a day (QID) | ORAL | Status: DC | PRN
  Administered 2019-11-01: 650 mg via ORAL
  Filled 2019-11-01: qty 2

## 2019-11-01 MED ORDER — ENOXAPARIN SODIUM 30 MG/0.3ML ~~LOC~~ SOLN
30.0000 mg | SUBCUTANEOUS | Status: DC
  Administered 2019-11-02 – 2019-11-03 (×2): 30 mg via SUBCUTANEOUS
  Filled 2019-11-01 (×2): qty 0.3

## 2019-11-01 MED ORDER — SODIUM CHLORIDE 0.9 % IV SOLN
500.0000 mg | INTRAVENOUS | Status: DC
  Administered 2019-11-01: 500 mg via INTRAVENOUS
  Filled 2019-11-01 (×2): qty 500

## 2019-11-01 MED ORDER — VANCOMYCIN HCL IN DEXTROSE 1-5 GM/200ML-% IV SOLN
1000.0000 mg | Freq: Once | INTRAVENOUS | Status: AC
  Administered 2019-11-01: 1000 mg via INTRAVENOUS
  Filled 2019-11-01: qty 200

## 2019-11-01 MED ORDER — OMEGA-3-ACID ETHYL ESTERS 1 G PO CAPS
1.0000 g | ORAL_CAPSULE | Freq: Every day | ORAL | Status: DC
  Administered 2019-11-01 – 2019-11-03 (×3): 1 g via ORAL
  Filled 2019-11-01 (×3): qty 1

## 2019-11-01 MED ORDER — TRAZODONE HCL 50 MG PO TABS: 25.0000 mg | ORAL_TABLET | Freq: Every evening | ORAL | Status: DC | PRN

## 2019-11-01 MED ORDER — AMLODIPINE BESYLATE 5 MG PO TABS: 5.0000 mg | ORAL_TABLET | Freq: Every day | ORAL | Status: DC

## 2019-11-01 MED ORDER — CALCIUM CARBONATE-VITAMIN D 500-200 MG-UNIT PO TABS
1.0000 | ORAL_TABLET | Freq: Every day | ORAL | Status: DC
  Administered 2019-11-01 – 2019-11-03 (×3): 1 via ORAL
  Filled 2019-11-01 (×3): qty 1

## 2019-11-01 MED ORDER — SODIUM CHLORIDE 0.9 % IV BOLUS
500.0000 mL | Freq: Once | INTRAVENOUS | Status: AC
  Administered 2019-11-01: 500 mL via INTRAVENOUS

## 2019-11-01 MED ORDER — ONDANSETRON HCL 4 MG PO TABS: 4.0000 mg | ORAL_TABLET | Freq: Four times a day (QID) | ORAL | Status: DC | PRN

## 2019-11-01 MED ORDER — SODIUM CHLORIDE 0.9 % IV SOLN: INTRAVENOUS | Status: DC

## 2019-11-01 MED ORDER — INSULIN ASPART 100 UNIT/ML ~~LOC~~ SOLN
0.0000 [IU] | Freq: Three times a day (TID) | SUBCUTANEOUS | Status: DC
  Administered 2019-11-01: 5 [IU] via SUBCUTANEOUS
  Administered 2019-11-01: 3 [IU] via SUBCUTANEOUS
  Administered 2019-11-01: 2 [IU] via SUBCUTANEOUS
  Administered 2019-11-01: 3 [IU] via SUBCUTANEOUS
  Administered 2019-11-02: 2 [IU] via SUBCUTANEOUS
  Administered 2019-11-02: 1 [IU] via SUBCUTANEOUS
  Administered 2019-11-02: 2 [IU] via SUBCUTANEOUS
  Administered 2019-11-02: 3 [IU] via SUBCUTANEOUS
  Administered 2019-11-03: 2 [IU] via SUBCUTANEOUS
  Filled 2019-11-01 (×10): qty 1

## 2019-11-01 MED ORDER — AMLODIPINE BESYLATE 5 MG PO TABS
5.0000 mg | ORAL_TABLET | Freq: Every day | ORAL | Status: DC
  Administered 2019-11-02 – 2019-11-03 (×2): 5 mg via ORAL
  Filled 2019-11-01 (×3): qty 1

## 2019-11-01 MED ORDER — ALBUTEROL SULFATE (2.5 MG/3ML) 0.083% IN NEBU: 2.5000 mg | INHALATION_SOLUTION | RESPIRATORY_TRACT | Status: DC | PRN

## 2019-11-01 MED ORDER — SODIUM CHLORIDE 0.9 % IV SOLN
2.0000 g | Freq: Once | INTRAVENOUS | Status: AC
  Administered 2019-11-01: 2 g via INTRAVENOUS
  Filled 2019-11-01: qty 2

## 2019-11-01 MED ORDER — GABAPENTIN 400 MG PO CAPS
800.0000 mg | ORAL_CAPSULE | Freq: Every day | ORAL | Status: DC
  Administered 2019-11-01 – 2019-11-02 (×2): 800 mg via ORAL
  Filled 2019-11-01 (×2): qty 2

## 2019-11-01 MED ORDER — ASPIRIN EC 81 MG PO TBEC
81.0000 mg | DELAYED_RELEASE_TABLET | Freq: Every day | ORAL | Status: DC
  Administered 2019-11-01 – 2019-11-03 (×3): 81 mg via ORAL
  Filled 2019-11-01 (×3): qty 1

## 2019-11-01 NOTE — Progress Notes (Signed)
[facility]  - [place] at [facility]   PATIENT NAME: [name]    MR#:  [id number]  DATE OF BIRTH:  Sep 19, 1945  SUBJECTIVE:    REVIEW OF SYSTEMS:   ROS Tolerating Diet: Tolerating PT:   DRUG ALLERGIES:  No Known Allergies  VITALS:  Blood pressure (!) 102/46, pulse 76, temperature 97.8 F (36.6 C), temperature source Oral, resp. rate 18, height 5' (1.524 m), weight 45 kg, SpO2 98 %.  PHYSICAL EXAMINATION:   Physical Exam  GENERAL:  74 y.o.-year-old patient lying in the bed with no acute distress.  EYES: Pupils equal, round, reactive to light and accommodation. No scleral icterus.   HEENT: Head atraumatic, normocephalic. Oropharynx and nasopharynx clear.  NECK:  Supple, no jugular venous distention. No thyroid enlargement, no tenderness.  LUNGS: Normal breath sounds bilaterally, no wheezing, rales, rhonchi. No use of accessory muscles of respiration.  CARDIOVASCULAR: S1, S2 normal. No murmurs, rubs, or gallops.  ABDOMEN: Soft, nontender, nondistended. Bowel sounds present. No organomegaly or mass.  EXTREMITIES: No cyanosis, clubbing or edema b/l.    NEUROLOGIC: Cranial nerves II through XII are intact. No focal Motor or sensory deficits b/l.   PSYCHIATRIC:  patient is alert and oriented x 3.  SKIN: No obvious rash, lesion, or ulcer.   LABORATORY PANEL:  CBC Recent Labs  Lab 11/01/19 0606  WBC 17.5*  HGB 10.7*  HCT 30.4*  PLT 275    Chemistries  Recent Labs  Lab 10/31/19 2314 10/31/19 2314 11/01/19 0606  NA 134*   < > 137  K 3.8   < > 3.9  CL 101   < > 109  CO2 22   < > 23  GLUCOSE 331*   < > 285*  BUN 17   < > 16  CREATININE 0.83   < > 0.81  CALCIUM 8.5*   < > 7.6*  AST 34  --   --   ALT 45*  --   --   ALKPHOS 127*  --   --   BILITOT 1.8*  --   --    < > = values in this interval not displayed.   Cardiac Enzymes No results for input(s): TROPONINI in the last 168 hours. RADIOLOGY:  CT Angio Chest PE W and/or Wo  Contrast  Addendum Date: 11/01/2019   ADDENDUM REPORT: 11/01/2019 02:15 ADDENDUM: Additional impression point: 7. Heterogeneous, mildly enlarged thyroid extending into the thoracic inlet. Recommend outpatient thyroid ultrasound for further assessment. This follows consensus guidelines: Managing Incidental Thyroid Nodules Detected on Imaging: White Paper of the ACR Incidental Thyroid Findings Committee. J Am Coll Radiol 2015; 12:143-150. and [facility] 3-tiered system for managing ITNs: J Am Coll Radiol. 2015; Feb;12(2): 143-50 Electronically Signed   By: Kreg Shropshire M.D.   On: 11/01/2019 02:15   Result Date: 11/01/2019 CLINICAL DATA:  Shortness of breath, difficulty breathing EXAM: CT ANGIOGRAPHY CHEST WITH CONTRAST TECHNIQUE: Multidetector CT imaging of the chest was performed using the standard protocol during bolus administration of intravenous contrast. Multiplanar CT image reconstructions and MIPs were obtained to evaluate the vascular anatomy. CONTRAST:  65mL OMNIPAQUE IOHEXOL 350 MG/ML SOLN COMPARISON:  Radiograph 10/31/2019, CT 10/03/2002 (report only) FINDINGS: Cardiovascular: Satisfactory opacification the pulmonary arteries. No central or lobar filling defects are identified. More distal evaluation limited by respiratory motion artifact more pronounced towards the lung bases and apices. Mild cardiomegaly. No pericardial effusion. Coronary artery calcifications are present. There is atherosclerotic calcification of the normal caliber thoracic aorta and  normally branching great vessels. No acute luminal or periaortic abnormality is seen. Mediastinum/Nodes: No mediastinal fluid or gas. Heterogeneous, enlarged thyroid gland partially extending into the thoracic inlet. No acute abnormality of the trachea or esophagus. No worrisome mediastinal, hilar or axillary adenopathy. Lungs/Pleura: Parenchymal evaluation limited given extensive respiratory motion artifact. No consolidation, features of edema, pneumothorax,  or effusion. Dependent atelectasis posteriorly. More bandlike opacity in the left lung base likely reflect subsegmental atelectasis or scarring. Upper Abdomen: Few dependently layering gallstones within the otherwise normal gallbladder. Fluid attenuation probable cyst measuring up to 2.6 cm seen in the caudate lobe. Diffuse hepatic hypoattenuation compatible with hepatic steatosis. Musculoskeletal: No chest wall mass or suspicious bone lesions identified. Multilevel degenerative changes are present in the imaged portions of the spine. Review of the MIP images confirms the above findings. IMPRESSION: 1. Satisfactory opacification the pulmonary arteries. No central or lobar filling defects are identified. More distal evaluation limited by respiratory motion artifact. 2. No acute cardiopulmonary process. 3. Mild cardiomegaly. Coronary artery calcifications. 4. Hepatic steatosis. 5. Cholelithiasis without CT evidence of acute cholecystitis. 6. Aortic Atherosclerosis (ICD10-I70.0). Electronically Signed: By: Lovena Le M.D. On: 11/01/2019 00:42   DG Chest Port 1 View  Result Date: 10/31/2019 CLINICAL DATA:  74 year old female with shortness of breath. EXAM: PORTABLE CHEST 1 VIEW COMPARISON:  Chest radiograph dated 07/31/2014. FINDINGS: Mild cardiomegaly with mild vascular congestion. Atypical infiltrate is not excluded clinical correlation is recommended. No focal consolidation, pleural effusion, pneumothorax. Atherosclerotic calcification of the aorta. Degenerative changes of the spine. No acute osseous pathology. IMPRESSION: Mild cardiomegaly with mild vascular congestion. No focal consolidation. Electronically Signed   By: Anner Crete M.D.   On: 10/31/2019 23:35   CT Renal Stone Study  Result Date: 11/01/2019 CLINICAL DATA:  74 year old female with flank pain. Concern for kidney stone. EXAM: CT ABDOMEN AND PELVIS WITHOUT CONTRAST TECHNIQUE: Multidetector CT imaging of the abdomen and pelvis was performed  following the standard protocol without IV contrast. COMPARISON:  Chest CT dated 11/01/2019. FINDINGS: Evaluation of this exam is limited in the absence of intravenous contrast. Lower chest: Minimal bibasilar atelectasis. The visualized lung bases are clear. No intra-abdominal free air or free fluid. Hepatobiliary: Diffuse fatty infiltration of the liver. No intrahepatic biliary ductal dilatation. Gallstone. No pericholecystic fluid. Pancreas: Unremarkable. No pancreatic ductal dilatation or surrounding inflammatory changes. Spleen: Normal in size without focal abnormality. Adrenals/Urinary Tract: The adrenal glands are unremarkable. There is slight asymmetric enlargement of the left kidney. There is heterogeneous enhancement of the left renal parenchyma with striated pattern nephrogram from recent contrast administration most consistent with left-sided pyelonephritis. Correlation with urinalysis recommended. No drainable fluid collection or abscess. The right kidney is unremarkable. The visualized ureters and urinary bladder appear unremarkable as well. Stomach/Bowel: There is no bowel obstruction or active inflammation. Mild diffuse thickened appearance of the stomach likely related to underdistention. The appendix is normal. Vascular/Lymphatic: Mild aortoiliac atherosclerotic disease. The IVC is unremarkable. No portal venous gas. There is no adenopathy. Reproductive: Enlarged myomatous uterus. No adnexal masses. Other: None Musculoskeletal: Osteopenia with degenerative changes of the spine. Old L1 compression fracture with anterior wedging. No acute osseous pathology. IMPRESSION: 1. Findings most consistent with left-sided pyelonephritis. Correlation with urinalysis recommended. No drainable fluid collection or abscess. 2. Fatty liver. 3. Cholelithiasis. 4. No bowel obstruction. Normal appendix. 5. Enlarged myomatous uterus. 6. Aortic Atherosclerosis (ICD10-I70.0). Electronically Signed   By: Anner Crete  M.D.   On: 11/01/2019 02:35   ASSESSMENT AND PLAN:  [name] is a 74 y.o. female with prior CVA, diabetes who comes in with shortness of breath and fatigue. Patient also had complains of left flank pain and some dark-colored urine. She noted few spots of blood according to the son. Patient had fever was admitted with sepsis due to left sided pyelonephritis.  1.Ecoli Sepsis likely secondary to acute left pyelonephritis w/o acute organ damage -BC 4/4 bottles grew E coli -Change to IV meropenem till c/s available -no fever today -IVF -remains in SR--d/c cardiac monitor  2.  Acute hypoxic respiratory distress resolved -oxygen wean to room air -denies any cough or shortness of breath. Chest x-ray no evidence of pneumonia. -She has history of mild asthma. Likely became transiently hypoxic in the setting of fever and sepsis -PRN nebs/inhaler  3.  Essential hypertension. -continue Norvasc and Zestril.  4.  Type 2 diabetes mellitus. - on supplemental coverage with NovoLog. -hold off Metformin.  5.  Dyslipidemia. -continue statin therapy.  6.  DVT prophylaxis. -Subcutaneous Lovenox.  7. Generalized weakness. Have physical therapy see patient  Procedures:none Family communication :son in the room Consults :none CODE STATUS: FULL DVT Prophylaxis :Lovenox  Status is: Inpatient  Remains inpatient appropriate because:IV treatments appropriate due to intensity of illness or inability to take PO blood culture positive for E. coli. Requires IV antibiotic.  Dispo: The patient is from: Home              Anticipated d/c is to: Home              Anticipated d/c date is: 2 days              Patient currently is not medically stable to d/c.  TOTAL TIME TAKING CARE OF THIS PATIENT: 35 minutes.  >50% time spent on counselling and coordination of care  Note: This dictation was prepared with Dragon dictation along with smaller phrase technology. Any transcriptional errors that  result from this process are unintentional.  Enedina Finner M.D    [facility]   CC: Primary care physician; [facility]Patient ID: [name], female   DOB: Jan 27, 1946, 74 y.o.   MRN: [id number]

## 2019-11-01 NOTE — Progress Notes (Signed)
CODE SEPSIS - PHARMACY COMMUNICATION  **Broad Spectrum Antibiotics should be administered within 1 hour of Sepsis diagnosis**  Time Code Sepsis Called/Page Received: 0071  Antibiotics Ordered: vanc/cefepime/flagyl  Time of 1st antibiotic administration: 0055  Additional action taken by pharmacy: sent secure chat to ED RN to give broad abx first which in this case was cefepime .  If necessary, Name of Provider/Nurse Contacted:     Thomasene Ripple ,PharmD Clinical Pharmacist  11/01/2019  12:57 AM

## 2019-11-01 NOTE — ED Notes (Signed)
purwick placed on pt 

## 2019-11-01 NOTE — Progress Notes (Signed)
PHARMACY - PHYSICIAN COMMUNICATION CRITICAL VALUE ALERT - BLOOD CULTURE IDENTIFICATION (BCID)  [name] is an 74 y.o. female who presented to [facility] on 10/31/2019  Assessment:  4/4 bottles E.coli, KPC-  Name of physician (or Provider) Contacted: Dr. Allena Katz  Current antibiotics: ceftriaxone, azithromycin  Changes to prescribed antibiotics recommended: meropenem   Pricilla Riffle, PharmD 11/01/2019  10:14 AM

## 2019-11-01 NOTE — Progress Notes (Signed)
PHARMACIST - PHYSICIAN COMMUNICATION  CONCERNING:  Enoxaparin (Lovenox) for DVT Prophylaxis    RECOMMENDATION: Patient was prescribed enoxaparin 40 mg q24 hours for VTE prophylaxis.   Filed Weights   10/31/19 2304 11/01/19 0300  Weight: 40 kg (88 lb 2.9 oz) 45 kg (99 lb 3.3 oz)    Body mass index is 19.38 kg/m.  Estimated Creatinine Clearance: 42.6 mL/min (by C-G formula based on SCr of 0.81 mg/dL).   Based on [facility] policy patient is candidate for enoxaparin 30mg  every 24 hours based on weight less than 45 kg for women  Patient is right at 45 kg, will decrease dose for now  DESCRIPTION: Pharmacy has adjusted enoxaparin dose per [facility] policy.  Patient is now receiving enoxaparin 30mg  every 24 hours.  [facility], PharmD Clinical Pharmacist  11/01/2019 2:32 PM

## 2019-11-01 NOTE — ED Notes (Signed)
Pt to CT

## 2019-11-01 NOTE — H&P (Addendum)
[place] at [facility]   PATIENT NAME: [name]    MR#:  [id number]  DATE OF BIRTH:  March 24, 1946  DATE OF ADMISSION:  10/31/2019  PRIMARY CARE PHYSICIAN: [facility]   REQUESTING/REFERRING PHYSICIAN: Artis Delay, MD  CHIEF COMPLAINT:   Chief Complaint  Patient presents with  . Shortness of Breath  . Fatigue    HISTORY OF PRESENT ILLNESS:  [name]  is a 74 y.o. [place] female with a known history of type 2 diabetes mellitus and hypertension, who presented to the emergency room with acute onset of dyspnea as well as fatigue and tiredness with associated hypoxia with pulse oximetry of 89% on room air.  Her son who gives translation from [place] reported no cough or wheezing.  No reported fever or chills.  No chest pain or palpitations.  No nausea or vomiting or abdominal pain.  She has reported blood in the urine as well as dysuria and left flank pain.   Upon presentation to the emergency room, temperature was 100.6 with heart rate of 118 and respiratory rate of 31 pulse oximetry of 94% on 2 L of O2 by nasal cannula.  Labs revealed hyperglycemia of 331 with sodium 134.  BNP was 133 and high-sensitivity troponin I was 70.  Lactic acid was 1.7 and procalcitonin was 6.96.  CBC showed leukocytosis of 13.1 with neutrophilia. EKG showed sinus tachycardia with a rate of 112 with T wave inversion laterally.  Urinalysis was positive for UTI and glucosuria with positive nitrite.  Portable chest x-ray showed mild cardiomegaly with mild vascular congestion with no focal consolidation.  Chest CTA revealed no evidence for PE.  There was no acute cardiopulmonary process.  It showed mild cardiomegaly and coronary artery calcifications, hepatic steatosis, cholelithiasis without cholecystitis and aortic atherosclerosis.  Spiral/renal stone CT showed findings consistent with left-sided pyelonephritis, fatty liver, cholelithiasis and enlarged myomatous uterus with  aortic atherosclerosis and normal appendix with no bowel obstruction.  The patient was given 1 g of p.o. Tylenol, 2 g of IV cefepime, 500 mg of IV Flagyl and 1 g of IV vancomycin as well as 1.5 L of IV normal saline.  She will be admitted to a medical monitored bed for further evaluation and management. PAST MEDICAL HISTORY:   Past Medical History:  Diagnosis Date  . Diabetes mellitus without complication (HCC)   . Hypertension   -History of pneumonia.  PAST SURGICAL HISTORY:  Cataract extraction.  SOCIAL HISTORY:   Social History   Tobacco Use  . Smoking status: Never Smoker  . Smokeless tobacco: Never Used  Substance Use Topics  . Alcohol use: Never    FAMILY HISTORY:  Positive for diabetes mellitus in her sister.  DRUG ALLERGIES:  No Known Allergies  REVIEW OF SYSTEMS:   ROS As per history of present illness. All pertinent systems were reviewed above. Constitutional,  HEENT, cardiovascular, respiratory, GI, GU, musculoskeletal, neuro, psychiatric, endocrine,  integumentary and hematologic systems were reviewed and are otherwise  negative/unremarkable except for positive findings mentioned above in the HPI.   MEDICATIONS AT HOME:   Prior to Admission medications   Medication Sig Start Date End Date Taking? Authorizing Provider  alendronate (FOSAMAX) 70 MG tablet Take 70 mg by mouth once a week. 08/19/19  Yes [provider]  amLODipine (NORVASC) 5 MG tablet Take 5 mg by mouth daily. 10/21/19  Yes [provider]  aspirin 81 MG EC tablet Take 81 mg by mouth daily.  Yes [provider]  Calcium Carbonate-Vitamin D 600-400 MG-UNIT tablet Take 1 tablet by mouth daily.    Yes [provider]  cetirizine (ZYRTEC) 10 MG tablet Take 1 tablet by mouth at bedtime   Yes [provider]  gabapentin (NEURONTIN) 800 MG tablet Take 800 mg by mouth at bedtime. 08/19/19  Yes [provider]  lisinopril (ZESTRIL) 40 MG tablet Take  40 mg by mouth daily. 10/21/19  Yes [provider]  metFORMIN (GLUCOPHAGE) 1000 MG tablet Take 1,000 mg by mouth daily. 10/21/19  Yes [provider]  Omega-3 Fatty Acids (FISH OIL) 1000 MG CAPS Take 1 capsule by mouth daily.    Yes [provider]  simvastatin (ZOCOR) 20 MG tablet Take 20 mg by mouth at bedtime. 10/21/19  Yes [provider]  SYMBICORT 160-4.5 MCG/ACT inhaler Inhale 2 puffs into the lungs daily.  09/11/19  Yes [provider]  VENTOLIN HFA 108 (90 Base) MCG/ACT inhaler Inhale 2 puffs into the lungs every 4 (four) hours as needed.  10/21/19  Yes [provider]      VITAL SIGNS:  Blood pressure (!) 98/48, pulse 93, temperature 100 F (37.8 C), temperature source Oral, resp. rate (!) 26, height 5' (1.524 m), weight 40 kg, SpO2 96 %.  PHYSICAL EXAMINATION:  Physical Exam  GENERAL:  74 y.o.-year-old [place] female patient lying in the bed with no acute distress.  EYES: Pupils equal, round, reactive to light and accommodation. No scleral icterus. Extraocular muscles intact.  HEENT: Head atraumatic, normocephalic. Oropharynx and nasopharynx clear.  NECK:  Supple, no jugular venous distention. No thyroid enlargement, no tenderness.  LUNGS: Normal breath sounds bilaterally, no wheezing, rales,rhonchi or crepitation. No use of accessory muscles of respiration.  CARDIOVASCULAR: Regular rate and rhythm, S1, S2 normal. No murmurs, rubs, or gallops.  ABDOMEN: Soft, nondistended, with left CVA tenderness . Bowel sounds present. No organomegaly or mass.  EXTREMITIES: No pedal edema, cyanosis, or clubbing.  NEUROLOGIC: Cranial nerves II through XII are intact. Muscle strength 5/5 in all extremities. Sensation intact. Gait not checked.  PSYCHIATRIC: The patient is alert and oriented x 3.  Normal affect and good eye contact. SKIN: No obvious rash, lesion, or ulcer.   LABORATORY PANEL:   CBC Recent Labs  Lab 10/31/19 2314  WBC  13.1*  HGB 12.3  HCT 34.7*  PLT 280   ------------------------------------------------------------------------------------------------------------------  Chemistries  Recent Labs  Lab 10/31/19 2314  NA 134*  K 3.8  CL 101  CO2 22  GLUCOSE 331*  BUN 17  CREATININE 0.83  CALCIUM 8.5*  AST 34  ALT 45*  ALKPHOS 127*  BILITOT 1.8*   ------------------------------------------------------------------------------------------------------------------  Cardiac Enzymes No results for input(s): TROPONINI in the last 168 hours. ------------------------------------------------------------------------------------------------------------------  RADIOLOGY:  CT Angio Chest PE W and/or Wo Contrast  Result Date: 11/01/2019 CLINICAL DATA:  Shortness of breath, difficulty breathing EXAM: CT ANGIOGRAPHY CHEST WITH CONTRAST TECHNIQUE: Multidetector CT imaging of the chest was performed using the standard protocol during bolus administration of intravenous contrast. Multiplanar CT image reconstructions and MIPs were obtained to evaluate the vascular anatomy. CONTRAST:  74mL OMNIPAQUE IOHEXOL 350 MG/ML SOLN COMPARISON:  Radiograph 10/31/2019, CT 10/03/2002 (report only) FINDINGS: Cardiovascular: Satisfactory opacification the pulmonary arteries. No central or lobar filling defects are identified. More distal evaluation limited by respiratory motion artifact more pronounced towards the lung bases and apices. Mild cardiomegaly. No pericardial effusion. Coronary artery calcifications are present. There is atherosclerotic calcification of the normal caliber thoracic  aorta and normally branching great vessels. No acute luminal or periaortic abnormality is seen. Mediastinum/Nodes: No mediastinal fluid or gas. Heterogeneous, enlarged thyroid gland partially extending into the thoracic inlet. No acute abnormality of the trachea or esophagus. No worrisome mediastinal, hilar or axillary adenopathy. Lungs/Pleura:  Parenchymal evaluation limited given extensive respiratory motion artifact. No consolidation, features of edema, pneumothorax, or effusion. Dependent atelectasis posteriorly. More bandlike opacity in the left lung base likely reflect subsegmental atelectasis or scarring. Upper Abdomen: Few dependently layering gallstones within the otherwise normal gallbladder. Fluid attenuation probable cyst measuring up to 2.6 cm seen in the caudate lobe. Diffuse hepatic hypoattenuation compatible with hepatic steatosis. Musculoskeletal: No chest wall mass or suspicious bone lesions identified. Multilevel degenerative changes are present in the imaged portions of the spine. Review of the MIP images confirms the above findings. IMPRESSION: 1. Satisfactory opacification the pulmonary arteries. No central or lobar filling defects are identified. More distal evaluation limited by respiratory motion artifact. 2. No acute cardiopulmonary process. 3. Mild cardiomegaly. Coronary artery calcifications. 4. Hepatic steatosis. 5. Cholelithiasis without CT evidence of acute cholecystitis. 6. Aortic Atherosclerosis (ICD10-I70.0). Electronically Signed   By: Kreg Shropshire M.D.   On: 11/01/2019 00:42   DG Chest Port 1 View  Result Date: 10/31/2019 CLINICAL DATA:  74 year old female with shortness of breath. EXAM: PORTABLE CHEST 1 VIEW COMPARISON:  Chest radiograph dated 07/31/2014. FINDINGS: Mild cardiomegaly with mild vascular congestion. Atypical infiltrate is not excluded clinical correlation is recommended. No focal consolidation, pleural effusion, pneumothorax. Atherosclerotic calcification of the aorta. Degenerative changes of the spine. No acute osseous pathology. IMPRESSION: Mild cardiomegaly with mild vascular congestion. No focal consolidation. Electronically Signed   By: Elgie Collard M.D.   On: 10/31/2019 23:35      IMPRESSION AND PLAN:   1.  Sepsis likely secondary to acute left pyelonephritis -The patient will be  admitted to a medically monitored bed. -We will place her on IV Rocephin. -We will follow blood cultures and urine culture.  2.  Acute hypoxic respiratory failure that could be related to possible lower respiratory infection. -O2 protocol will be followed. -IV Rocephin and Zithromax will be continued. -We will place the patient on duo nebs on scheduled and as needed basis.  3.  Essential hypertension. -We will continue Norvasc and Zestril.  4.  Type 2 diabetes mellitus. -We will place the patient on supplemental coverage with NovoLog. -We will hold off Metformin.  5.  Dyslipidemia. -We will continue statin therapy.  6.  DVT prophylaxis. -Subcutaneous Lovenox.   All the records are reviewed and case discussed with ED provider. The plan of care was discussed in details with the patient (and family). I answered all questions. The patient agreed to proceed with the above mentioned plan. Further management will depend upon hospital course.   CODE STATUS: Full code  Status is: Inpatient  Remains inpatient appropriate because:Ongoing diagnostic testing needed not appropriate for outpatient work up, Unsafe d/c plan, IV treatments appropriate due to intensity of illness or inability to take PO and Inpatient level of care appropriate due to severity of illness   Dispo: The patient is from: Home              Anticipated d/c is to: Home              Anticipated d/c date is: 2 days              Patient currently is not medically stable to d/c.   TOTAL  TIME TAKING CARE OF THIS PATIENT: 55 minutes.    Hannah Beat M.D on 11/01/2019 at 1:31 AM  [facility]   From 7 PM-7 AM, contact night-coverage [email or web address]  CC: Primary care physician; [facility]   Note: This dictation was prepared with Nurse, children's dictation along with smaller phrase technology. Any transcriptional errors that result from this process are unintentional.

## 2019-11-01 NOTE — Progress Notes (Signed)
PHARMACY -  BRIEF ANTIBIOTIC NOTE   Pharmacy has received consult(s) for vancomycin/cefepime from an ED provider.  The patient's profile has been reviewed for ht/wt/allergies/indication/available labs.    One time order(s) placed for vanc 1g IV load, cefepime 2g IV x 1  Further antibiotics/pharmacy consults should be ordered by admitting physician if indicated.                       Thank you,  Thomasene Ripple, PharmD, BCPS Clinical Pharmacist 11/01/2019  3:14 AM

## 2019-11-02 DIAGNOSIS — A419 Sepsis, unspecified organism: Secondary | ICD-10-CM

## 2019-11-02 LAB — URINE CULTURE: Culture: >=100000 — AB

## 2019-11-02 LAB — GLUCOSE, CAPILLARY
Glucose-Capillary: 153 mg/dL — ABNORMAL HIGH (ref 70–99)
Glucose-Capillary: 199 mg/dL — ABNORMAL HIGH (ref 70–99)
Glucose-Capillary: 232 mg/dL — ABNORMAL HIGH (ref 70–99)

## 2019-11-02 MED ORDER — METFORMIN HCL 500 MG PO TABS
1000.0000 mg | ORAL_TABLET | Freq: Every day | ORAL | Status: DC
  Administered 2019-11-02 – 2019-11-03 (×2): 1000 mg via ORAL
  Filled 2019-11-02 (×2): qty 2

## 2019-11-02 NOTE — Evaluation (Signed)
Physical Therapy Evaluation Patient Details Name: [name] MRN: [id number] DOB: 05/22/1946 Today's Date: 11/02/2019   History of Present Illness  Patient is a 74 y/o F that presents shortness of breath and fatigue. She has been managed for acute L pyelonephritis.  Clinical Impression  Patient is a 74 y/o F that presents with shortness of breath and fatigue. Per RN and MD notes she is able to speak conversational English, and was able to interact appropriately with therapist this date. She lives with her son, who is her primary caregiver. She typically ambulates with a RW, is currently quite deconditioned and demonstrates generalized weakness requiring mod-max A to complete bed mobility. She was unable to tolerate long distance ambulation on room air today, RN gave supplemental O2 which markedly improved her tolerance for ambulation. She would likely benefit from skilled PT services to address her mobility deficits and at this time would likely be more appropriate for SNF placement.     Follow Up Recommendations SNF    Equipment Recommendations  Rolling walker with 5" wheels    Recommendations for Other Services       Precautions / Restrictions Precautions Precautions: Fall Restrictions Weight Bearing Restrictions: No      Mobility  Bed Mobility Overal bed mobility: Needs Assistance Bed Mobility: Supine to Sit     Supine to sit: Mod assist;Max assist     General bed mobility comments: Patient requires mod-max A x1 with assist largely for trunkal stability.  Transfers Overall transfer level: Needs assistance Equipment used: Rolling walker (2 wheeled) Transfers: Sit to/from Stand Sit to Stand: Min guard         General transfer comment: Patient is able to transfer sit to stand w/ use of RW and minimal guarding.  Ambulation/Gait Ambulation/Gait assistance: Min guard Gait Distance (Feet): 25 Feet Assistive device: Rolling walker (2 wheeled) Gait Pattern/deviations:  WFL(Within Functional Limits)   Gait velocity interpretation: <1.31 ft/sec, indicative of household ambulator General Gait Details: Patient pushes the RW and lifts frequently, able to turn appropriately.  Stairs            Wheelchair Mobility    Modified Rankin (Stroke Patients Only)       Balance Overall balance assessment: Needs assistance Sitting-balance support: Bilateral upper extremity supported;Feet supported Sitting balance-Leahy Scale: Fair Sitting balance - Comments: Lost balance initially posteriorly, but was able to correct with time.   Standing balance support: Bilateral upper extremity supported Standing balance-Leahy Scale: Fair                               Pertinent Vitals/Pain Pain Assessment: No/denies pain    Home Living Family/patient expects to be discharged to:: Private residence Living Arrangements: Children Available Help at Discharge: Family Type of Home: House Home Access: Level entry     Home Layout: One level Home Equipment: Environmental consultant - 2 wheels      Prior Function Level of Independence: Independent with assistive device(s)         Comments: Patient has been able to ambulate with RW in the house, lives with son who is primary caregiver.     Hand Dominance        Extremity/Trunk Assessment   Upper Extremity Assessment Upper Extremity Assessment: Overall WFL for tasks assessed    Lower Extremity Assessment Lower Extremity Assessment: Overall WFL for tasks assessed       Communication   Communication: Other (comment)(Patient speaks broken [place].  Is able to softly ask questions and interact with therapist.)  Cognition Arousal/Alertness: Awake/alert Behavior During Therapy: WFL for tasks assessed/performed Overall Cognitive Status: Within Functional Limits for tasks assessed                                        General Comments      Exercises General Exercises - Lower Extremity Long  Arc Quad: AROM;Both;15 reps Hip Flexion/Marching: AROM;Both;15 reps Mini-Sqauts: AROM;10 reps(Sit to stands from chair) Other Exercises Other Exercises: With 2L of O2 supplemental, patient able to ambulate into hallway and back with SpO2 to 95% after with cga x 1 and RW x 75'   Assessment/Plan    PT Assessment Patient needs continued PT services  PT Problem List Decreased strength;Decreased mobility;Decreased safety awareness;Decreased balance;Decreased knowledge of use of DME;Decreased activity tolerance;Cardiopulmonary status limiting activity;Decreased knowledge of precautions       PT Treatment Interventions DME instruction;Therapeutic exercise;Gait training;Balance training;Stair training;Neuromuscular re-education;Therapeutic activities;Patient/family education    PT Goals (Current goals can be found in the Care Plan section)  Acute Rehab PT Goals Patient Stated Goal: To return home safely PT Goal Formulation: With patient Time For Goal Achievement: 11/16/19 Potential to Achieve Goals: Good    Frequency Min 2X/week   Barriers to discharge Decreased caregiver support Unsure of current caregiver support    Co-evaluation               AM-PAC PT "6 Clicks" Mobility  Outcome Measure Help needed turning from your back to your side while in a flat bed without using bedrails?: A Lot Help needed moving from lying on your back to sitting on the side of a flat bed without using bedrails?: A Lot Help needed moving to and from a bed to a chair (including a wheelchair)?: A Little Help needed standing up from a chair using your arms (e.g., wheelchair or bedside chair)?: A Little Help needed to walk in hospital room?: A Little Help needed climbing 3-5 steps with a railing? : A Lot 6 Click Score: 15    End of Session Equipment Utilized During Treatment: Gait belt Activity Tolerance: Patient tolerated treatment well Patient left: with chair alarm set;in chair Nurse  Communication: Mobility status PT Visit Diagnosis: Difficulty in walking, not elsewhere classified (R26.2)    Time: 1330-1350 PT Time Calculation (min) (ACUTE ONLY): 20 min   Charges:   PT Evaluation $PT Eval Moderate Complexity: 1 Mod PT Treatments $Therapeutic Exercise: 8-22 mins       Royce Macadamia PT, DPT, CSCS    11/02/2019, 2:16 PM

## 2019-11-02 NOTE — Progress Notes (Signed)
[facility]  - [place] [facility]   PATIENT NAME: [name]    MR#:  [id number]  DATE OF BIRTH:  08-01-1945  SUBJECTIVE:  Pt overall feels well. She understands english--and speaks some also. Son in the room Pt eating BF No new issues per RN REVIEW OF SYSTEMS:   Review of Systems  Constitutional: Negative for chills, fever and weight loss.  HENT: Negative for ear discharge, ear pain and nosebleeds.   Eyes: Negative for blurred vision, pain and discharge.  Respiratory: Negative for sputum production, shortness of breath, wheezing and stridor.   Cardiovascular: Negative for chest pain, palpitations, orthopnea and PND.  Gastrointestinal: Negative for abdominal pain, diarrhea, nausea and vomiting.  Genitourinary: Negative for frequency and urgency.  Musculoskeletal: Negative for back pain and joint pain.  Neurological: Positive for weakness. Negative for sensory change, speech change and focal weakness.  Psychiatric/Behavioral: Negative for depression and hallucinations. The patient is not nervous/anxious.    Tolerating Diet:yes Tolerating PT: pending  DRUG ALLERGIES:  No Known Allergies  VITALS:  Blood pressure (!) 148/59, pulse 88, temperature 99.2 F (37.3 C), temperature source Oral, resp. rate 18, height 5' (1.524 m), weight 45 kg, SpO2 91 %.  PHYSICAL EXAMINATION:   Physical Exam  GENERAL:  74 y.o.-year-old patient lying in the bed with no acute distress.  EYES: Pupils equal, round, reactive to light and accommodation. No scleral icterus.   HEENT: Head atraumatic, normocephalic. Oropharynx and nasopharynx clear.  NECK:  Supple, no jugular venous distention. No thyroid enlargement, no tenderness.  LUNGS: Normal breath sounds bilaterally, no wheezing, rales, rhonchi. No use of accessory muscles of respiration.  CARDIOVASCULAR: S1, S2 normal. No murmurs, rubs, or gallops.  ABDOMEN: Soft, nontender, nondistended. Bowel sounds present. No organomegaly or  mass.  EXTREMITIES: No cyanosis, clubbing or edema b/l.    NEUROLOGIC: Cranial nerves II through XII are intact. No focal Motor or sensory deficits b/l.   PSYCHIATRIC:  patient is alert and oriented x 3.  SKIN: No obvious rash, lesion, or ulcer.   LABORATORY PANEL:  CBC Recent Labs  Lab 11/01/19 0606  WBC 17.5*  HGB 10.7*  HCT 30.4*  PLT 275    Chemistries  Recent Labs  Lab 10/31/19 2314 10/31/19 2314 11/01/19 0606  NA 134*   < > 137  K 3.8   < > 3.9  CL 101   < > 109  CO2 22   < > 23  GLUCOSE 331*   < > 285*  BUN 17   < > 16  CREATININE 0.83   < > 0.81  CALCIUM 8.5*   < > 7.6*  AST 34  --   --   ALT 45*  --   --   ALKPHOS 127*  --   --   BILITOT 1.8*  --   --    < > = values in this interval not displayed.   Cardiac Enzymes No results for input(s): TROPONINI in the last 168 hours. RADIOLOGY:  CT Angio Chest PE W and/or Wo Contrast  Addendum Date: 11/01/2019   ADDENDUM REPORT: 11/01/2019 02:15 ADDENDUM: Additional impression point: 7. Heterogeneous, mildly enlarged thyroid extending into the thoracic inlet. Recommend outpatient thyroid ultrasound for further assessment. This follows consensus guidelines: Managing Incidental Thyroid Nodules Detected on Imaging: White Paper of the ACR Incidental Thyroid Findings Committee. J Am Coll Radiol 2015; 12:143-150. and [facility] 3-tiered system for managing ITNs: J Am Coll Radiol. 2015; Feb;12(2): 143-50 Electronically Signed  By: Kreg Shropshire M.D.   On: 11/01/2019 02:15   Result Date: 11/01/2019 CLINICAL DATA:  Shortness of breath, difficulty breathing EXAM: CT ANGIOGRAPHY CHEST WITH CONTRAST TECHNIQUE: Multidetector CT imaging of the chest was performed using the standard protocol during bolus administration of intravenous contrast. Multiplanar CT image reconstructions and MIPs were obtained to evaluate the vascular anatomy. CONTRAST:  85mL OMNIPAQUE IOHEXOL 350 MG/ML SOLN COMPARISON:  Radiograph 10/31/2019, CT 10/03/2002 (report only)  FINDINGS: Cardiovascular: Satisfactory opacification the pulmonary arteries. No central or lobar filling defects are identified. More distal evaluation limited by respiratory motion artifact more pronounced towards the lung bases and apices. Mild cardiomegaly. No pericardial effusion. Coronary artery calcifications are present. There is atherosclerotic calcification of the normal caliber thoracic aorta and normally branching great vessels. No acute luminal or periaortic abnormality is seen. Mediastinum/Nodes: No mediastinal fluid or gas. Heterogeneous, enlarged thyroid gland partially extending into the thoracic inlet. No acute abnormality of the trachea or esophagus. No worrisome mediastinal, hilar or axillary adenopathy. Lungs/Pleura: Parenchymal evaluation limited given extensive respiratory motion artifact. No consolidation, features of edema, pneumothorax, or effusion. Dependent atelectasis posteriorly. More bandlike opacity in the left lung base likely reflect subsegmental atelectasis or scarring. Upper Abdomen: Few dependently layering gallstones within the otherwise normal gallbladder. Fluid attenuation probable cyst measuring up to 2.6 cm seen in the caudate lobe. Diffuse hepatic hypoattenuation compatible with hepatic steatosis. Musculoskeletal: No chest wall mass or suspicious bone lesions identified. Multilevel degenerative changes are present in the imaged portions of the spine. Review of the MIP images confirms the above findings. IMPRESSION: 1. Satisfactory opacification the pulmonary arteries. No central or lobar filling defects are identified. More distal evaluation limited by respiratory motion artifact. 2. No acute cardiopulmonary process. 3. Mild cardiomegaly. Coronary artery calcifications. 4. Hepatic steatosis. 5. Cholelithiasis without CT evidence of acute cholecystitis. 6. Aortic Atherosclerosis (ICD10-I70.0). Electronically Signed: By: Kreg Shropshire M.D. On: 11/01/2019 00:42   DG Chest Port  1 View  Result Date: 10/31/2019 CLINICAL DATA:  74 year old female with shortness of breath. EXAM: PORTABLE CHEST 1 VIEW COMPARISON:  Chest radiograph dated 07/31/2014. FINDINGS: Mild cardiomegaly with mild vascular congestion. Atypical infiltrate is not excluded clinical correlation is recommended. No focal consolidation, pleural effusion, pneumothorax. Atherosclerotic calcification of the aorta. Degenerative changes of the spine. No acute osseous pathology. IMPRESSION: Mild cardiomegaly with mild vascular congestion. No focal consolidation. Electronically Signed   By: Elgie Collard M.D.   On: 10/31/2019 23:35   CT Renal Stone Study  Result Date: 11/01/2019 CLINICAL DATA:  73 year old female with flank pain. Concern for kidney stone. EXAM: CT ABDOMEN AND PELVIS WITHOUT CONTRAST TECHNIQUE: Multidetector CT imaging of the abdomen and pelvis was performed following the standard protocol without IV contrast. COMPARISON:  Chest CT dated 11/01/2019. FINDINGS: Evaluation of this exam is limited in the absence of intravenous contrast. Lower chest: Minimal bibasilar atelectasis. The visualized lung bases are clear. No intra-abdominal free air or free fluid. Hepatobiliary: Diffuse fatty infiltration of the liver. No intrahepatic biliary ductal dilatation. Gallstone. No pericholecystic fluid. Pancreas: Unremarkable. No pancreatic ductal dilatation or surrounding inflammatory changes. Spleen: Normal in size without focal abnormality. Adrenals/Urinary Tract: The adrenal glands are unremarkable. There is slight asymmetric enlargement of the left kidney. There is heterogeneous enhancement of the left renal parenchyma with striated pattern nephrogram from recent contrast administration most consistent with left-sided pyelonephritis. Correlation with urinalysis recommended. No drainable fluid collection or abscess. The right kidney is unremarkable. The visualized ureters and urinary bladder appear unremarkable as  well.  Stomach/Bowel: There is no bowel obstruction or active inflammation. Mild diffuse thickened appearance of the stomach likely related to underdistention. The appendix is normal. Vascular/Lymphatic: Mild aortoiliac atherosclerotic disease. The IVC is unremarkable. No portal venous gas. There is no adenopathy. Reproductive: Enlarged myomatous uterus. No adnexal masses. Other: None Musculoskeletal: Osteopenia with degenerative changes of the spine. Old L1 compression fracture with anterior wedging. No acute osseous pathology. IMPRESSION: 1. Findings most consistent with left-sided pyelonephritis. Correlation with urinalysis recommended. No drainable fluid collection or abscess. 2. Fatty liver. 3. Cholelithiasis. 4. No bowel obstruction. Normal appendix. 5. Enlarged myomatous uterus. 6. Aortic Atherosclerosis (ICD10-I70.0). Electronically Signed   By: Anner Crete M.D.   On: 11/01/2019 02:35   ASSESSMENT AND PLAN:   [name] is a 74 y.o. female with prior CVA, diabetes who comes in with shortness of breath and fatigue. Patient also had complains of left flank pain and some dark-colored urine. She noted few spots of blood according to the son. Patient had fever was admitted with sepsis due to left sided pyelonephritis.  1.Ecoli Sepsis likely secondary to acute left pyelonephritis w/o acute organ damage -BC 4/4 bottles grew E coli -Change to IV meropenem --change to po cipro at discharge (total 7 days)-no fever today -received IVF -remains in SR--d/c cardiac monitor  2.  Acute hypoxic respiratory distress resolved -oxygen wean to room air -denies any cough or shortness of breath. Chest x-ray no evidence of pneumonia. --She has history of mild asthma. Likely became transiently hypoxic in the setting of fever and sepsis -PRN nebs/inhaler  3. Essential hypertension. -continue Norvasc and Zestril.  4.Type 2 diabetes mellitus. - on supplemental coverage with NovoLog. -resume Metformin.  5.   Dyslipidemia. -continue statin therapy.  6.  DVT prophylaxis. -Subcutaneous Lovenox.  7. Generalized weakness. Have physical therapy see patient  Procedures:none Family communication :son in the room Consults :none CODE STATUS: FULL DVT Prophylaxis :Lovenox  Status is: Inpatient  Remains inpatient appropriate because:IV treatments appropriate due to intensity of illness or inability to take PO blood culture positive for E. coli. Requires IV antibiotic.  Dispo: The patient is from: Home              Anticipated d/c is to: Home              Anticipated d/c date is: likely may 9th              Patient currently is not medically stable to d/c.Need 1 more day of IV abxs   TOTAL TIME TAKING CARE OF THIS PATIENT: 25 minutes.  >50% time spent on counselling and coordination of care  Note: This dictation was prepared with Dragon dictation along with smaller phrase technology. Any transcriptional errors that result from this process are unintentional.  Fritzi Mandes M.D    [facility]   CC: Primary care physician; [name], female   DOB: 1946-02-04, 74 y.o.   MRN: [id number]

## 2019-11-03 LAB — CULTURE, BLOOD (ROUTINE X 2)
Special Requests: ADEQUATE
Special Requests: ADEQUATE

## 2019-11-03 LAB — CBC
HCT: 36.1 % (ref 36.0–46.0)
Hemoglobin: 12.5 g/dL (ref 12.0–15.0)
MCH: 31.1 pg (ref 26.0–34.0)
MCHC: 34.6 g/dL (ref 30.0–36.0)
MCV: 89.8 fL (ref 80.0–100.0)
Platelets: 348 10*3/uL (ref 150–400)
RBC: 4.02 MIL/uL (ref 3.87–5.11)
RDW: 12.2 % (ref 11.5–15.5)
WBC: 10.1 10*3/uL (ref 4.0–10.5)
nRBC: 0 % (ref 0.0–0.2)

## 2019-11-03 LAB — GLUCOSE, CAPILLARY: Glucose-Capillary: 159 mg/dL — ABNORMAL HIGH (ref 70–99)

## 2019-11-03 MED ORDER — CIPROFLOXACIN HCL 500 MG PO TABS: 500.0000 mg | ORAL_TABLET | Freq: Two times a day (BID) | ORAL | 0 refills | Status: AC

## 2019-11-03 MED ORDER — CIPROFLOXACIN HCL 500 MG PO TABS
500.0000 mg | ORAL_TABLET | Freq: Two times a day (BID) | ORAL | Status: DC
  Administered 2019-11-03: 500 mg via ORAL
  Filled 2019-11-03 (×2): qty 1

## 2019-11-03 NOTE — Discharge Summary (Signed)
[facility] - [facility] at [facility]   PATIENT NAME: [name]    MR#:  [id number]  DATE OF BIRTH:  [birth year]  DATE OF ADMISSION:  10/31/2019 ADMITTING PHYSICIAN: [name] BeatJan A Mansy, MD  DATE OF DISCHARGE: 11/03/2019  PRIMARY CARE PHYSICIAN: [name]    ADMISSION DIAGNOSIS:  Sepsis (HCC) [A41.9]  DISCHARGE DIAGNOSIS:  Sepsis on admission resolved Ecoli- sepsis with left sided Pyelonephritis  SECONDARY DIAGNOSIS:   Past Medical History:  Diagnosis Date  . Diabetes mellitus without complication (HCC)   . Hypertension     HOSPITAL COURSE:   [name] BirkenheadBong Nguyenis a 74 y.o.femalewith prior CVA, diabetes who comes in with shortness of breath and fatigue. Patient also had complains of left flank pain and some dark-colored urine. She noted few spots of blood according to the son. Patient had fever was admitted with sepsis due to left sided pyelonephritis.  1.Ecoli Sepsis likely secondary to acute left pyelonephritis w/o acute organ damage -BC 4/4 bottles grew E coli -Change to IV meropenem --changed to po cipro at discharge (total 7 days)-no fever today -received IVF -remains in SR--d/c cardiac monitor  2. Acute hypoxic respiratory distress resolved -oxygen wean to room air -denies any cough or shortness of breath. Chest x-ray no evidence of pneumonia. --She has history of mild asthma. Likely became transiently hypoxic in the setting of fever and sepsis -PRN nebs/inhaler -pt denies sob  3. Essential hypertension. -continue Norvasc and Zestril.  4.Type 2 diabetes mellitus. - on supplementalcoverage with NovoLog. -resume Metformin.  5. Dyslipidemia. -continue statin therapy.  6. DVT prophylaxis. -Subcutaneous Lovenox.  7. Generalized weakness.  physical therapy recommends rehab.d/w pt's son and he prefers HHPT. He will make sure someone will be with pt to help her. TOC for HHPT  Procedures:none Family communication :son  in the room Consults :none CODE STATUS: FULL DVT Prophylaxis :Lovenox  Status is: Inpatient Dispo: The patient is from: Home  Anticipated d/c is to: Home  Anticipated d/c date is:  may 9th  Patient currently is  medically stable to d/c  CONSULTS OBTAINED:    DRUG ALLERGIES:  No Known Allergies  DISCHARGE MEDICATIONS:   Allergies as of 11/03/2019   No Known Allergies     Medication List    TAKE these medications   alendronate 70 MG tablet Commonly known as: FOSAMAX Take 70 mg by mouth once a week.   amLODipine 5 MG tablet Commonly known as: NORVASC Take 5 mg by mouth daily.   aspirin 81 MG EC tablet Take 81 mg by mouth daily.   Calcium Carbonate-Vitamin D 600-400 MG-UNIT tablet Take 1 tablet by mouth daily.   cetirizine 10 MG tablet Commonly known as: ZYRTEC Take 1 tablet by mouth at bedtime   ciprofloxacin 500 MG tablet Commonly known as: CIPRO Take 1 tablet (500 mg total) by mouth 2 (two) times daily for 5 days.   Fish Oil 1000 MG Caps Take 1 capsule by mouth daily.   gabapentin 800 MG tablet Commonly known as: NEURONTIN Take 800 mg by mouth at bedtime.   lisinopril 40 MG tablet Commonly known as: ZESTRIL Take 40 mg by mouth daily.   metFORMIN 1000 MG tablet Commonly known as: GLUCOPHAGE Take 1,000 mg by mouth daily.   simvastatin 20 MG tablet Commonly known as: ZOCOR Take 20 mg by mouth at bedtime.   Symbicort 160-4.5 MCG/ACT inhaler Generic drug: budesonide-formoterol Inhale 2 puffs into the lungs daily.   Ventolin HFA 108 (90 Base) MCG/ACT  inhaler Generic drug: albuterol Inhale 2 puffs into the lungs every 4 (four) hours as needed.       If you experience worsening of your admission symptoms, develop shortness of breath, life threatening emergency, suicidal or homicidal thoughts you must seek medical attention immediately by calling 911 or calling your MD immediately  if symptoms less severe.  You  Must read complete instructions/literature along with all the possible adverse reactions/side effects for all the Medicines you take and that have been prescribed to you. Take any new Medicines after you have completely understood and accept all the possible adverse reactions/side effects.   Please note  You were cared for by a hospitalist during your hospital stay. If you have any questions about your discharge medications or the care you received while you were in the hospital after you are discharged, you can call the unit and asked to speak with the hospitalist on call if the hospitalist that took care of you is not available. Once you are discharged, your primary care physician will handle any further medical issues. Please note that NO REFILLS for any discharge medications will be authorized once you are discharged, as it is imperative that you return to your primary care physician (or establish a relationship with a primary care physician if you do not have one) for your aftercare needs so that they can reassess your need for medications and monitor your lab values. Today   SUBJECTIVE  patient denies any respiratory distress or shortness of breath. Overall feeling okay.  VITAL SIGNS:  Blood pressure (!) 145/56, pulse 74, temperature 98.8 F (37.1 C), temperature source Oral, resp. rate 16, height 5' (1.524 m), weight 45 kg, SpO2 97 %.  I/O:    Intake/Output Summary (Last 24 hours) at 11/03/2019 0838 Last data filed at 11/03/2019 0715 Gross per 24 hour  Intake 340 ml  Output 2650 ml  Net -2310 ml    PHYSICAL EXAMINATION:  GENERAL:  74 y.o.-year-old patient lying in the bed with no acute distress.  EYES: Pupils equal, round, reactive to light and accommodation. No scleral icterus.  HEENT: Head atraumatic, normocephalic. Oropharynx and nasopharynx clear.  NECK:  Supple, no jugular venous distention. No thyroid enlargement, no tenderness.  LUNGS: Normal breath sounds bilaterally, no  wheezing, rales,rhonchi or crepitation. No use of accessory muscles of respiration.  CARDIOVASCULAR: S1, S2 normal. No murmurs, rubs, or gallops.  ABDOMEN: Soft, non-tender, non-distended. Bowel sounds present. No organomegaly or mass.  EXTREMITIES: No pedal edema, cyanosis, or clubbing.  NEUROLOGIC: Cranial nerves II through XII are intact. Muscle strength 5/5 in all extremities. Sensation intact. Gait not checked.  PSYCHIATRIC: The patient is alert and oriented x 3.  SKIN: No obvious rash, lesion, or ulcer.   DATA REVIEW:   CBC  Recent Labs  Lab 11/03/19 0326  WBC 10.1  HGB 12.5  HCT 36.1  PLT 348    Chemistries  Recent Labs  Lab 10/31/19 2314 10/31/19 2314 11/01/19 0606  NA 134*   < > 137  K 3.8   < > 3.9  CL 101   < > 109  CO2 22   < > 23  GLUCOSE 331*   < > 285*  BUN 17   < > 16  CREATININE 0.83   < > 0.81  CALCIUM 8.5*   < > 7.6*  AST 34  --   --   ALT 45*  --   --   ALKPHOS 127*  --   --  BILITOT 1.8*  --   --    < > = values in this interval not displayed.    Microbiology Results   Recent Results (from the past 240 hour(s))  Blood Culture (routine x 2)     Status: Abnormal   Collection Time: 10/31/19 11:14 PM   Specimen: BLOOD LEFT HAND  Result Value Ref Range Status   Specimen Description   Final    BLOOD LEFT HAND Performed at [facility], [address] [place], [place] [address]    Special Requests   Final    BOTTLES DRAWN AEROBIC AND ANAEROBIC Blood Culture adequate volume Performed at [facility], [address] [place], [place] [address]    Culture  Setup Time   Final    GRAM NEGATIVE RODS IN BOTH AEROBIC AND ANAEROBIC BOTTLES CRITICAL RESULT CALLED TO, READ BACK BY AND VERIFIED WITH: Rayna Sexton AT 9242 ON 11/01/2019 [place]. Performed at [facility], [place] [address] [place], [place] [address]    Culture (A)  Final    ESCHERICHIA COLI Confirmed Extended Spectrum Beta-Lactamase Producer (ESBL).  In bloodstream  infections from ESBL organisms, carbapenems are preferred over piperacillin/tazobactam. They are shown to have a lower risk of mortality.    Report Status 11/03/2019 FINAL  Final   Organism ID, Bacteria ESCHERICHIA COLI  Final      Susceptibility   Escherichia coli - MIC*    AMPICILLIN >=32 RESISTANT Resistant     CEFAZOLIN >=64 RESISTANT Resistant     CEFEPIME RESISTANT Resistant     CEFTAZIDIME RESISTANT Resistant     CEFTRIAXONE >=64 RESISTANT Resistant     CIPROFLOXACIN 0.5 SENSITIVE Sensitive     GENTAMICIN <=1 SENSITIVE Sensitive     IMIPENEM <=0.25 SENSITIVE Sensitive     TRIMETH/SULFA >=320 RESISTANT Resistant     AMPICILLIN/SULBACTAM 16 INTERMEDIATE Intermediate     PIP/TAZO <=4 SENSITIVE Sensitive     * ESCHERICHIA COLI  Blood Culture ID Panel (Reflexed)     Status: Abnormal   Collection Time: 10/31/19 11:14 PM  Result Value Ref Range Status   Enterococcus species NOT DETECTED NOT DETECTED Final   Listeria monocytogenes NOT DETECTED NOT DETECTED Final   Staphylococcus species NOT DETECTED NOT DETECTED Final   Staphylococcus aureus (BCID) NOT DETECTED NOT DETECTED Final   Streptococcus species NOT DETECTED NOT DETECTED Final   Streptococcus agalactiae NOT DETECTED NOT DETECTED Final   Streptococcus pneumoniae NOT DETECTED NOT DETECTED Final   Streptococcus pyogenes NOT DETECTED NOT DETECTED Final   Acinetobacter baumannii NOT DETECTED NOT DETECTED Final   Enterobacteriaceae species DETECTED (A) NOT DETECTED Final    Comment: Enterobacteriaceae represent a large family of gram-negative bacteria, not a single organism. CRITICAL RESULT CALLED TO, READ BACK BY AND VERIFIED WITH: Rayna Sexton AT 9622 ON 11/01/2019 [place].    Enterobacter cloacae complex NOT DETECTED NOT DETECTED Final   Escherichia coli DETECTED (A) NOT DETECTED Final    Comment: CRITICAL RESULT CALLED TO, READ BACK BY AND VERIFIED WITHRayna Sexton AT 2979 ON 11/01/2019 [place].    Klebsiella oxytoca NOT DETECTED NOT  DETECTED Final   Klebsiella pneumoniae NOT DETECTED NOT DETECTED Final   Proteus species NOT DETECTED NOT DETECTED Final   Serratia marcescens NOT DETECTED NOT DETECTED Final   Carbapenem resistance NOT DETECTED NOT DETECTED Final   Haemophilus influenzae NOT DETECTED NOT DETECTED Final   Neisseria meningitidis NOT DETECTED NOT DETECTED Final   Pseudomonas aeruginosa NOT DETECTED NOT DETECTED Final  Candida albicans NOT DETECTED NOT DETECTED Final   Candida glabrata NOT DETECTED NOT DETECTED Final   Candida krusei NOT DETECTED NOT DETECTED Final   Candida parapsilosis NOT DETECTED NOT DETECTED Final   Candida tropicalis NOT DETECTED NOT DETECTED Final    Comment: Performed at [facility], [address] [place], [place] [address]  Blood Culture (routine x 2)     Status: Abnormal   Collection Time: 10/31/19 11:19 PM   Specimen: Left Antecubital; Blood  Result Value Ref Range Status   Specimen Description   Final    LEFT ANTECUBITAL Performed at [facility], [address], [place], [place] [address]    Special Requests   Final    BOTTLES DRAWN AEROBIC AND ANAEROBIC Blood Culture adequate volume Performed at [facility], [address] [place], [place] [address]    Culture  Setup Time   Final    GRAM NEGATIVE RODS IN BOTH AEROBIC AND ANAEROBIC BOTTLES CRITICAL VALUE NOTED.  VALUE IS CONSISTENT WITH PREVIOUSLY REPORTED AND CALLED VALUE. Performed at [facility], [address] [place], [place] [address]    Culture (A)  Final    ESCHERICHIA COLI SUSCEPTIBILITIES PERFORMED ON PREVIOUS CULTURE WITHIN THE LAST 5 DAYS. Performed at [facility], [address], [place], [place] [address]    Report Status 11/03/2019 FINAL  Final  Urine culture     Status: Abnormal   Collection Time: 10/31/19 11:43 PM   Specimen: In/Out Cath Urine  Result Value Ref Range Status   Specimen Description   Final    IN/OUT CATH URINE Performed at  [facility], [address], [place], [place] [address]    Special Requests   Final    NONE Performed at [facility], [address] [place], [place] [address]    Culture (A)  Final    >=100,000 COLONIES/mL ESCHERICHIA COLI Confirmed Extended Spectrum Beta-Lactamase Producer (ESBL).  In bloodstream infections from ESBL organisms, carbapenems are preferred over piperacillin/tazobactam. They are shown to have a lower risk of mortality.    Report Status 11/02/2019 FINAL  Final   Organism ID, Bacteria ESCHERICHIA COLI (A)  Final      Susceptibility   Escherichia coli - MIC*    AMPICILLIN >=32 RESISTANT Resistant     CEFAZOLIN >=64 RESISTANT Resistant     CEFTRIAXONE >=64 RESISTANT Resistant     CIPROFLOXACIN 0.5 SENSITIVE Sensitive     GENTAMICIN <=1 SENSITIVE Sensitive     IMIPENEM <=0.25 SENSITIVE Sensitive     NITROFURANTOIN <=16 SENSITIVE Sensitive     TRIMETH/SULFA >=320 RESISTANT Resistant     AMPICILLIN/SULBACTAM 16 INTERMEDIATE Intermediate     PIP/TAZO <=4 SENSITIVE Sensitive     * >=100,000 COLONIES/mL ESCHERICHIA COLI  Respiratory Panel by RT PCR (Flu A&B, Covid) - Nasopharyngeal Swab     Status: None   Collection Time: 10/31/19 11:43 PM   Specimen: Nasopharyngeal Swab  Result Value Ref Range Status   SARS Coronavirus 2 by RT PCR NEGATIVE NEGATIVE Final    Comment: (NOTE) SARS-CoV-2 target nucleic acids are NOT DETECTED. The SARS-CoV-2 RNA is generally detectable in upper respiratoy specimens during the acute phase of infection. The lowest concentration of SARS-CoV-2 viral copies this assay can detect is 131 copies/mL. A negative result does not preclude SARS-Cov-2 infection and should not be used as the sole basis for treatment or other patient management decisions. A negative result may occur with  improper specimen collection/handling, submission  of specimen other than nasopharyngeal swab, presence of viral mutation(s) within the areas targeted  by this assay, and inadequate number of viral copies (<131 copies/mL). A negative result must be combined with clinical observations, patient history, and epidemiological information. The expected result is Negative. Fact Sheet for Patients:  https://www.moore.com/ Fact Sheet for Healthcare Providers:  https://www.young.biz/ This test is not yet ap proved or cleared by the [place] FDA and  has been authorized for detection and/or diagnosis of SARS-CoV-2 by FDA under an Emergency Use Authorization (EUA). This EUA will remain  in effect (meaning this test can be used) for the duration of the COVID-19 declaration under Section 564(b)(1) of the Act, 21 U.S.C. section 360bbb-3(b)(1), unless the authorization is terminated or revoked sooner.    Influenza A by PCR NEGATIVE NEGATIVE Final   Influenza B by PCR NEGATIVE NEGATIVE Final    Comment: (NOTE) The Xpert Xpress SARS-CoV-2/FLU/RSV assay is intended as an aid in  the diagnosis of influenza from Nasopharyngeal swab specimens and  should not be used as a sole basis for treatment. Nasal washings and  aspirates are unacceptable for Xpert Xpress SARS-CoV-2/FLU/RSV  testing. Fact Sheet for Patients: https://www.moore.com/ Fact Sheet for Healthcare Providers: https://www.young.biz/ This test is not yet approved or cleared by the [place] FDA and  has been authorized for detection and/or diagnosis of SARS-CoV-2 by  FDA under an Emergency Use Authorization (EUA). This EUA will remain  in effect (meaning this test can be used) for the duration of the  Covid-19 declaration under Section 564(b)(1) of the Act, 21  U.S.C. section 360bbb-3(b)(1), unless the authorization is  terminated or revoked. Performed at [facility], [address] [place], [place] [address]     RADIOLOGY:  No results found.   CODE STATUS:     Code Status Orders   (From admission, onward)         Start     Ordered   11/01/19 0159  Full code  Continuous     11/01/19 0203        Code Status History    This patient has a current code status but no historical code status.   Advance Care Planning Activity       TOTAL TIME TAKING CARE OF THIS PATIENT: 40 minutes.    Enedina Finner M.D  [facility]    CC: Primary care physician; [facility], [facility]

## 2019-11-03 NOTE — Progress Notes (Signed)
fbs 147

## 2019-11-03 NOTE — Plan of Care (Signed)

## 2019-11-03 NOTE — Progress Notes (Signed)
Patient discharging home. Instructions given to patient and son. IV removed. Son transported patient home. Verbalized understanding of instructions.

## 2019-11-03 NOTE — TOC Transition Note (Signed)
Transition of Care [facility]) - CM/SW Discharge Note   Patient Details  Name: [name] MRN: [id number] Date of Birth: Jun 03, 1946  Transition of Care [facility]) CM/SW Contact:  Boris Sharper, LCSW Phone Number: 11/03/2019, 8:57 AM   Clinical Narrative:    PT medically stable for discharge per MD. CSW was contacted by the MD and notified that pt's son wanted HHPT instead of the recommended SNF. CSW spoke to pt's son about preferences in [facility] agencies, he did not have a preference. Pt's son stated that pt lives with him and he will be transporting her home. Pt's son notified CSW that they needed a RW. CSW notified Brad with [facility], RW will be delivered to pt's room. CSW contacted  Malachy Mood with [facility] and they are able to accept. [facility] to follow for [facility] PT.   Final next level of care: Home w Home Health Services Barriers to Discharge: No Barriers Identified   Patient Goals and CMS Choice Patient states their goals for this hospitalization and ongoing recovery are:: Son stated to get back home CMS Medicare.gov Compare Post Acute Care list provided to:: Patient Represenative (must comment)(Son) Choice offered to / list presented to : Adult Children  Discharge Placement                Patient to be transferred to facility by: Son Name of family member notified: Tai Patient and family notified of of transfer: 11/03/19  Discharge Plan and Services                DME Arranged: Gilford Rile rolling DME Agency: [employer] Date DME Agency Contacted: 11/03/19 Time DME Agency Contacted: [phone] Representative spoke with at DME Agency: [place]: PT [place]: [place] Date [place]: 11/03/19 Time [place]: [phone] Representative spoke with at [place]: [place] Determinants of Health ([place]) Interventions     Readmission Risk Interventions No flowsheet data found.

## 2019-11-03 NOTE — Discharge Instructions (Signed)
Vim th?n, Ng??i l?n Pyelonephritis, Adult Vim th?n l m?t b?nh nhi?m trng x?y ra ? th?n. Th?n l c? quan l?c mu c?a m?t ng??i v ??a ch?t th?i ra kh?i dng mu vo n??c ti?u. N??c ti?u ?i t? th?n, qua ?ng c tn l ni?u qu?n v vo bng quang. C hai lo?i vim b? th?n chnh:  Nhi?m trng xu?t hi?n nhanh chng m khng c b?t c? c?nh bo no (vim b? th?n c?p tnh).  Nhi?m trng ko di trong m?t th?i gian di (vim b? th?n m?n tnh). Trong h?u h?t cc tr??ng h?p, nhi?m trng s? kh?i khi ???c ?i?u tr? v khng gy thm v?n ??. Nh?ng nhi?m trng n?ng h?n ho?c nhi?m trng m?n tnh ?i khi c th? lan vo dng mu ho?c d?n ??n m?t s? v?n ?? v?i th?n. Nguyn nhn g gy ra? Tnh tr?ng ny th??ng do:  Vi khu?n ?i t? bng quang ln th?n. ?i?u ny c th? x?y ra sau khi b? nhi?m trng bng quang (vim bng quang) ho?c nhi?m trng ???ng ti?u (UTI).  Nhi?m trng bng quang do vi khu?n di chuy?n t? dng mu sang th?n gy ra. ?i?u g lm t?ng nguy c?? Tnh tr?ng ny c nhi?u kh? n?ng x?y ra h?n ?:  Ph? n? c [place].  Ng??i gi.  Nh?ng ng??i c b?t k? tnh tr?ng no sau ?y: ? Ti?u ???ng. ? Vim tuy?n ti?n li?t (b?nh vim tuy?n ti?n li?t), ? nam gi?i. ? S?i th?n ho?c s?i bng quang. ? Nh?ng b?t th??ng c?a th?n ho?c ni?u qu?n. ? Ung th?.  Ng??i c ??t ?ng thng ? bng quang.  Ng??i c quan h? tnh d?c.  Ph? n? s? d?ng ch?t di?t tinh trng.  Nh?ng ng??i ? c UTI tr??c ?y. Cc d?u hi?u ho?c tri?u ch?ng g? Nh?ng tri?u ch?ng c?a tnh tr?ng ny bao g?m:  Th??ng xuyn ti?u ti?n.  R?t mu?n ho?c lin t?c mu?n ti?u ti?n.  Nng rt ho?c ?au nh?c khi ti?u ti?n.  ?au b?ng.  ?au l?ng.  ?au ? bn c?nh ng??i ho?c m?ng s??n.  S?t ho?c ?n l?nh.  Mu trong n??c ti?u ho?c n??c ti?u s?m mu.  Bu?n nn ho?c nn. Ch?n ?on tnh tr?ng ny nh? th? no? Tnh tr?ng ny c th? ???c ch?n ?on d?a vo:  B?nh s? v khm th?c th?.  Xt nghi?m n??c ti?u.  Xt nghi?m mu. Qu v? c?ng c th? c cc  ki?m tra hnh ?nh c?a th?n khc, ch?ng h?n nh? siu m ho?c ch?p CT (ch?p c?t l?p). Tnh tr?ng ny ???c ?i?u tr? nh? th? no? Vi?c ?i?u tr? b?nh ny c th? ty thu?c vo m?c ?? n?ng c?a nhi?m trng.  N?u nhi?m trng l nh? v ???c pht hi?n s?m, qu v? c th? ???c ?i?u tr? b?ng cch u?ng (dng qua ???ng mi?ng) thu?c khng sinh. Qu v? s? c?n u?ng n??c ?? c? th? khng b? m?t n??c.  N?u nhi?m trng n?ng h?n, qu v? c th? c?n n?m vi?n v ???c dng khng sinh tr?c ti?p vo t?nh m?ch qua m?t ???ng truy?n t?nh m?ch (IV). Qu v? c th? c?ng c?n ???c truy?n d?ch thng qua IV n?u qu v? khng th? duy tr ?? n??c cho c? th?. Sau khi n?m vi?n, qu v? c th? c?n u?ng thu?c khng sinh trong m?t th?i gian. Nh?ng bi?n php ?i?u tr? khc c th? c?n, ty thu?c vo nguyn nhn gy nhi?m trng. Tun th? nh?ng h??ng d?n ny ? nh:  Thu?c  Dng thu?c khng sinh theo ch? d?n c?a chuyn gia ch?m [place] s?c kh?e. Khng d?ng s? d?ng thu?c khng sinh ngay c? khi qu v? b?t ??u c?m th?y ?? h?n.  Ch? s? d?ng thu?c khng k ??n v thu?c k ??n theo ch? d?n c?a chuyn gia ch?m [place] s?c kh?e. H??ng d?n chung   U?ng ?? n??c ?? gi? cho n??c ti?u c mu vng nh?t.  Trnh caffein, tr v cc ?? u?ng c ga. Nh?ng ?? u?ng ny c xu h??ng gy kch thch bng quang.  ?i ti?u th??ng xuyn. Trnh nh?n ?i ti?u trong th?i gian di.  ?i ti?u tr??c v sau khi quan h? tnh d?c.  Sau khi ??i ti?n, n? gi?i c?n v? sinh t? tr??c ra sau. Ch? dng m?t kh?n gi?y m?i l?n.  Tun th? t?t c? cc l?n khm theo di theo ch? d?n c?a chuyn gia ch?m [place] s?c kh?e. ?i?u ny c vai tr quan tr?ng. Hy lin l?c v?i chuyn gia ch?m [place] s?c kh?e n?u:  Cc tri?u ch?ng c?a qu v? khng ?? h?n sau 2 ngy ?i?u tr?.  Tri?u ch?ng c?a qu v? tr?m tr?ng h?n.  Qu v? b? s?t. Yu c?u tr? gip ngay l?p t?c n?u qu v?:  Qu v? khng th? dng khng sinh ho?c u?ng ?? l?ng.  Qu v? b? ?n l?nh rng mnh.  Nn m?a.  Qu v? b? ?au r?t nhi?u ? m?ng s??n ho?c ?  l?ng.  Qu v? r?t y?u ho?c b? ng?t x?u. Tm t?t  Vim th?n l nhi?m trng ???ng ti?u (UTI) x?y ra ? th?n.  Vi?c ?i?u tr? b?nh ny c th? ty thu?c vo m?c ?? n?ng c?a nhi?m trng.  Dng thu?c khng sinh theo ch? d?n c?a chuyn gia ch?m [place] s?c kh?e. Khng d?ng s? d?ng thu?c khng sinh ngay c? khi qu v? b?t ??u c?m th?y ?? h?n.  U?ng ?? n??c ?? gi? cho n??c ti?u c mu vng nh?t.  Tun th? t?t c? cc l?n khm theo di theo ch? d?n c?a chuyn gia ch?m [place] s?c kh?e. ?i?u ny c vai tr quan tr?ng. Thng tin ny khng nh?m m?c ?ch thay th? cho l?i khuyn m chuyn gia ch?m [place] s?c kh?e ni v?i qu v?. Hy b?o ??m qu v? ph?i th?o lu?n b?t k? v?n ?? g m qu v? c v?i chuyn gia ch?m  s?c kh?e c?a qu v?. Document Revised: 05/15/2018 Document Reviewed: 05/15/2018 Elsevier Patient Education  2020 [employer].

## 2019-11-04 LAB — GLUCOSE, CAPILLARY: Glucose-Capillary: 147 mg/dL — ABNORMAL HIGH (ref 70–99)
